# Patient Record
Sex: Female | Born: 1975 | Race: White | Hispanic: No | Smoking: Current every day smoker
Health system: Southern US, Community
[De-identification: ages and names within clinical notes are randomized; demographics above are authoritative.]

## PROBLEM LIST (undated history)

## (undated) DIAGNOSIS — F419 Anxiety disorder, unspecified: Secondary | ICD-10-CM

## (undated) DIAGNOSIS — J449 Chronic obstructive pulmonary disease, unspecified: Secondary | ICD-10-CM

## (undated) DIAGNOSIS — F32A Depression, unspecified: Secondary | ICD-10-CM

## (undated) DIAGNOSIS — F319 Bipolar disorder, unspecified: Secondary | ICD-10-CM

## (undated) DIAGNOSIS — D649 Anemia, unspecified: Secondary | ICD-10-CM

## (undated) DIAGNOSIS — R569 Unspecified convulsions: Secondary | ICD-10-CM

## (undated) DIAGNOSIS — F329 Major depressive disorder, single episode, unspecified: Secondary | ICD-10-CM

## (undated) DIAGNOSIS — K219 Gastro-esophageal reflux disease without esophagitis: Secondary | ICD-10-CM

## (undated) HISTORY — DX: Unspecified convulsions: R56.9

## (undated) HISTORY — DX: Gastro-esophageal reflux disease without esophagitis: K21.9

## (undated) HISTORY — DX: Bipolar disorder, unspecified: F31.9

## (undated) HISTORY — DX: Depression, unspecified: F32.A

## (undated) HISTORY — DX: Anemia, unspecified: D64.9

## (undated) HISTORY — DX: Chronic obstructive pulmonary disease, unspecified: J44.9

## (undated) HISTORY — DX: Major depressive disorder, single episode, unspecified: F32.9

## (undated) HISTORY — DX: Anxiety disorder, unspecified: F41.9

---
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
## 2004-08-16 ENCOUNTER — Ambulatory Visit: Payer: Self-pay | Admitting: Family Medicine

## 2005-09-15 ENCOUNTER — Ambulatory Visit: Payer: Self-pay | Admitting: Family Medicine

## 2005-09-21 ENCOUNTER — Ambulatory Visit (HOSPITAL_COMMUNITY): Admission: RE | Admit: 2005-09-21 | Discharge: 2005-09-21 | Payer: Self-pay | Admitting: Family Medicine

## 2005-10-03 ENCOUNTER — Encounter: Payer: Self-pay | Admitting: Family Medicine

## 2005-10-04 ENCOUNTER — Ambulatory Visit: Payer: Self-pay | Admitting: Family Medicine

## 2007-11-19 ENCOUNTER — Emergency Department (HOSPITAL_COMMUNITY): Admission: EM | Admit: 2007-11-19 | Discharge: 2007-11-19 | Payer: Self-pay | Admitting: Emergency Medicine

## 2009-09-09 ENCOUNTER — Encounter: Payer: Self-pay | Admitting: Family Medicine

## 2010-09-30 ENCOUNTER — Emergency Department (HOSPITAL_COMMUNITY)
Admission: EM | Admit: 2010-09-30 | Discharge: 2010-09-30 | Payer: Self-pay | Source: Home / Self Care | Admitting: Emergency Medicine

## 2010-10-30 ENCOUNTER — Encounter: Payer: Self-pay | Admitting: Family Medicine

## 2010-11-09 NOTE — Letter (Signed)
Summary: OFFICE NOTE  OFFICE NOTE   Imported By: Lind Guest 06/02/2010 15:27:40  _____________________________________________________________________  External Attachment:    Type:   Image     Comment:   External Document

## 2011-07-01 LAB — DIFFERENTIAL
Lymphocytes Relative: 26
Lymphs Abs: 2.1
Monocytes Relative: 7
Neutro Abs: 5.3
Neutrophils Relative %: 66

## 2011-07-01 LAB — URINALYSIS, ROUTINE W REFLEX MICROSCOPIC
Ketones, ur: NEGATIVE
Nitrite: POSITIVE — AB
Specific Gravity, Urine: 1.01
pH: 7

## 2011-07-01 LAB — BASIC METABOLIC PANEL
Calcium: 9
Chloride: 106
Creatinine, Ser: 0.73
GFR calc Af Amer: >60
GFR calc non Af Amer: >60

## 2011-07-01 LAB — CBC
MCV: 89
RBC: 4.5
WBC: 8.2

## 2011-07-01 LAB — ETHANOL: Alcohol, Ethyl (B): <5

## 2011-07-01 LAB — RAPID URINE DRUG SCREEN, HOSP PERFORMED
Benzodiazepines: POSITIVE — AB
Tetrahydrocannabinol: NOT DETECTED

## 2011-07-01 LAB — URINE MICROSCOPIC-ADD ON

## 2011-07-01 LAB — PREGNANCY, URINE: Preg Test, Ur: NEGATIVE

## 2014-09-09 ENCOUNTER — Inpatient Hospital Stay (HOSPITAL_COMMUNITY)
Admission: AD | Admit: 2014-09-09 | Discharge: 2014-09-15 | DRG: 200 | Disposition: A | Discharge status: Home / Self Care | Payer: Self-pay | Source: Other Acute Inpatient Hospital | Attending: General Surgery | Admitting: General Surgery

## 2014-09-09 DIAGNOSIS — H1131 Conjunctival hemorrhage, right eye: Secondary | ICD-10-CM | POA: Diagnosis present

## 2014-09-09 DIAGNOSIS — S028XXA Fractures of other specified skull and facial bones, initial encounter for closed fracture: Secondary | ICD-10-CM | POA: Diagnosis present

## 2014-09-09 DIAGNOSIS — D62 Acute posthemorrhagic anemia: Secondary | ICD-10-CM | POA: Diagnosis present

## 2014-09-09 DIAGNOSIS — S270XXA Traumatic pneumothorax, initial encounter: Secondary | ICD-10-CM | POA: Diagnosis present

## 2014-09-09 DIAGNOSIS — Z9689 Presence of other specified functional implants: Secondary | ICD-10-CM

## 2014-09-09 DIAGNOSIS — S272XXA Traumatic hemopneumothorax, initial encounter: Secondary | ICD-10-CM

## 2014-09-09 DIAGNOSIS — S2243XA Multiple fractures of ribs, bilateral, initial encounter for closed fracture: Secondary | ICD-10-CM | POA: Diagnosis present

## 2014-09-09 DIAGNOSIS — S02402A Zygomatic fracture, unspecified, initial encounter for closed fracture: Secondary | ICD-10-CM | POA: Diagnosis present

## 2014-09-09 DIAGNOSIS — T797XXA Traumatic subcutaneous emphysema, initial encounter: Secondary | ICD-10-CM | POA: Diagnosis present

## 2014-09-09 DIAGNOSIS — S0292XA Unspecified fracture of facial bones, initial encounter for closed fracture: Secondary | ICD-10-CM | POA: Diagnosis present

## 2014-09-09 DIAGNOSIS — J939 Pneumothorax, unspecified: Principal | ICD-10-CM | POA: Diagnosis present

## 2014-09-09 DIAGNOSIS — F17210 Nicotine dependence, cigarettes, uncomplicated: Secondary | ICD-10-CM | POA: Diagnosis present

## 2014-09-09 DIAGNOSIS — F4310 Post-traumatic stress disorder, unspecified: Secondary | ICD-10-CM | POA: Diagnosis present

## 2014-09-09 DIAGNOSIS — S32049A Unspecified fracture of fourth lumbar vertebra, initial encounter for closed fracture: Secondary | ICD-10-CM | POA: Diagnosis present

## 2014-09-09 DIAGNOSIS — F419 Anxiety disorder, unspecified: Secondary | ICD-10-CM | POA: Diagnosis present

## 2014-09-09 LAB — CBC
HEMATOCRIT: 37.6 % (ref 36.0–46.0)
Hemoglobin: 12.6 g/dL (ref 12.0–15.0)
MCH: 30 pg (ref 26.0–34.0)
MCHC: 33.5 g/dL (ref 30.0–36.0)
MCV: 89.5 fL (ref 78.0–100.0)
Platelets: 260 10*3/uL (ref 150–400)
RBC: 4.2 MIL/uL (ref 3.87–5.11)
RDW: 13.9 % (ref 11.5–15.5)
WBC: 7.1 10*3/uL (ref 4.0–10.5)

## 2014-09-09 LAB — CREATININE, SERUM
Creatinine, Ser: 0.83 mg/dL (ref 0.50–1.10)
GFR calc non Af Amer: 88 mL/min — ABNORMAL LOW (ref >90)

## 2014-09-09 LAB — MRSA PCR SCREENING: MRSA by PCR: NEGATIVE

## 2014-09-09 MED ORDER — OXYCODONE HCL 5 MG PO TABS
10.0000 mg | ORAL_TABLET | ORAL | Status: DC | PRN
  Administered 2014-09-11 (×2): 10 mg via ORAL
  Filled 2014-09-09 (×2): qty 2

## 2014-09-09 MED ORDER — ONDANSETRON HCL 4 MG PO TABS: 4.0000 mg | ORAL_TABLET | Freq: Four times a day (QID) | ORAL | Status: DC | PRN

## 2014-09-09 MED ORDER — PANTOPRAZOLE SODIUM 40 MG PO TBEC
40.0000 mg | DELAYED_RELEASE_TABLET | Freq: Every day | ORAL | Status: DC
  Administered 2014-09-09 – 2014-09-10 (×2): 40 mg via ORAL
  Filled 2014-09-09 (×2): qty 1

## 2014-09-09 MED ORDER — ONDANSETRON HCL 4 MG/2ML IJ SOLN: 4.0000 mg | Freq: Four times a day (QID) | INTRAMUSCULAR | Status: DC | PRN

## 2014-09-09 MED ORDER — HYDROMORPHONE HCL 1 MG/ML IJ SOLN
1.0000 mg | Freq: Once | INTRAMUSCULAR | Status: AC
  Administered 2014-09-09: 1 mg via INTRAVENOUS

## 2014-09-09 MED ORDER — DIAZEPAM 5 MG/ML IJ SOLN: 10.0000 mg | Freq: Three times a day (TID) | INTRAMUSCULAR | Status: DC | PRN

## 2014-09-09 MED ORDER — HYDROMORPHONE HCL 1 MG/ML IJ SOLN
INTRAMUSCULAR | Status: AC
  Filled 2014-09-09: qty 1

## 2014-09-09 MED ORDER — ENOXAPARIN SODIUM 40 MG/0.4ML ~~LOC~~ SOLN
40.0000 mg | SUBCUTANEOUS | Status: DC
  Administered 2014-09-10 – 2014-09-15 (×6): 40 mg via SUBCUTANEOUS
  Filled 2014-09-09 (×6): qty 0.4

## 2014-09-09 MED ORDER — OXYCODONE HCL 5 MG PO TABS
5.0000 mg | ORAL_TABLET | ORAL | Status: DC | PRN
  Administered 2014-09-10 – 2014-09-11 (×6): 5 mg via ORAL
  Filled 2014-09-09 (×6): qty 1

## 2014-09-09 MED ORDER — KCL IN DEXTROSE-NACL 20-5-0.45 MEQ/L-%-% IV SOLN
INTRAVENOUS | Status: DC
  Administered 2014-09-09 – 2014-09-11 (×3): via INTRAVENOUS
  Administered 2014-09-11: 50 mL/h via INTRAVENOUS
  Filled 2014-09-09 (×5): qty 1000

## 2014-09-09 MED ORDER — PANTOPRAZOLE SODIUM 40 MG IV SOLR
40.0000 mg | Freq: Every day | INTRAVENOUS | Status: DC
  Filled 2014-09-09 (×2): qty 40

## 2014-09-09 MED ORDER — HYDROMORPHONE HCL 1 MG/ML IJ SOLN
1.0000 mg | INTRAMUSCULAR | Status: DC | PRN
  Administered 2014-09-09 – 2014-09-10 (×2): 1 mg via INTRAVENOUS
  Filled 2014-09-09 (×2): qty 1
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
## 2014-09-10 ENCOUNTER — Inpatient Hospital Stay (HOSPITAL_COMMUNITY): Payer: PRIVATE HEALTH INSURANCE

## 2014-09-10 LAB — CBC
HCT: 38.7 % (ref 36.0–46.0)
HEMOGLOBIN: 12.7 g/dL (ref 12.0–15.0)
MCH: 28.9 pg (ref 26.0–34.0)
MCHC: 32.8 g/dL (ref 30.0–36.0)
MCV: 88 fL (ref 78.0–100.0)
Platelets: 246 10*3/uL (ref 150–400)
RBC: 4.4 MIL/uL (ref 3.87–5.11)
RDW: 13.8 % (ref 11.5–15.5)
WBC: 7.5 10*3/uL (ref 4.0–10.5)

## 2014-09-10 LAB — BASIC METABOLIC PANEL
Anion gap: 9 (ref 5–15)
BUN: 5 mg/dL — ABNORMAL LOW (ref 6–23)
CO2: 27 mEq/L (ref 19–32)
Calcium: 8.7 mg/dL (ref 8.4–10.5)
Chloride: 104 mEq/L (ref 96–112)
Creatinine, Ser: 0.8 mg/dL (ref 0.50–1.10)
GFR calc Af Amer: >90 mL/min (ref >90)
GFR calc non Af Amer: >90 mL/min (ref >90)
GLUCOSE: 112 mg/dL — AB (ref 70–99)
POTASSIUM: 3.5 meq/L — AB (ref 3.7–5.3)
Sodium: 140 mEq/L (ref 137–147)

## 2014-09-10 MED ORDER — IPRATROPIUM-ALBUTEROL 0.5-2.5 (3) MG/3ML IN SOLN
3.0000 mL | RESPIRATORY_TRACT | Status: DC
  Administered 2014-09-10 (×3): 3 mL via RESPIRATORY_TRACT
  Filled 2014-09-10 (×3): qty 3
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
## 2014-09-10 NOTE — Progress Notes (Signed)
Orthopedic Tech Progress Note Patient Details:  Caitlyn JewShana D Irwin 01-Jun-1976 [id number]  Patient ID: Caitlyn Irwin, female   DOB: 01-Jun-1976, 38 y.o.   MRN: [id number]  Called in bio-tech brace order; spoke with Anderson MaltaStephanie Martinez Boxx 09/10/2014, 12:05 PM

## 2014-09-10 NOTE — Progress Notes (Addendum)
Charting error. Disregard progress note.
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
## 2014-09-10 NOTE — Progress Notes (Signed)
UR completed.  Kmarion Rawl, RN BSN MHA CCM Trauma/Neuro ICU Case Manager [phone]  
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
## 2014-09-11 ENCOUNTER — Inpatient Hospital Stay (HOSPITAL_COMMUNITY): Payer: PRIVATE HEALTH INSURANCE

## 2014-09-11 ENCOUNTER — Encounter (HOSPITAL_COMMUNITY): Payer: Self-pay

## 2014-09-11 LAB — BASIC METABOLIC PANEL
Anion gap: 11 (ref 5–15)
BUN: 3 mg/dL — AB (ref 6–23)
CHLORIDE: 102 meq/L (ref 96–112)
CO2: 27 mEq/L (ref 19–32)
CREATININE: 0.69 mg/dL (ref 0.50–1.10)
Calcium: 8.5 mg/dL (ref 8.4–10.5)
GFR calc non Af Amer: >90 mL/min (ref >90)
GLUCOSE: 117 mg/dL — AB (ref 70–99)
POTASSIUM: 3.5 meq/L — AB (ref 3.7–5.3)
Sodium: 140 mEq/L (ref 137–147)

## 2014-09-11 LAB — CBC WITH DIFFERENTIAL/PLATELET
BASOS PCT: 0 % (ref 0–1)
Basophils Absolute: 0 10*3/uL (ref 0.0–0.1)
Eosinophils Absolute: 0.2 10*3/uL (ref 0.0–0.7)
Eosinophils Relative: 2 % (ref 0–5)
HEMATOCRIT: 35.7 % — AB (ref 36.0–46.0)
HEMOGLOBIN: 11.6 g/dL — AB (ref 12.0–15.0)
LYMPHS ABS: 1.6 10*3/uL (ref 0.7–4.0)
Lymphocytes Relative: 19 % (ref 12–46)
MCH: 28.6 pg (ref 26.0–34.0)
MCHC: 32.5 g/dL (ref 30.0–36.0)
MCV: 87.9 fL (ref 78.0–100.0)
MONO ABS: 0.5 10*3/uL (ref 0.1–1.0)
MONOS PCT: 6 % (ref 3–12)
Neutro Abs: 6.5 10*3/uL (ref 1.7–7.7)
Neutrophils Relative %: 73 % (ref 43–77)
Platelets: 238 10*3/uL (ref 150–400)
RBC: 4.06 MIL/uL (ref 3.87–5.11)
RDW: 14 % (ref 11.5–15.5)
WBC: 8.8 10*3/uL (ref 4.0–10.5)

## 2014-09-11 MED ORDER — POLYETHYLENE GLYCOL 3350 17 G PO PACK
17.0000 g | PACK | Freq: Every day | ORAL | Status: DC
  Administered 2014-09-12 – 2014-09-15 (×4): 17 g via ORAL
  Filled 2014-09-11 (×5): qty 1

## 2014-09-11 MED ORDER — DOCUSATE SODIUM 100 MG PO CAPS
100.0000 mg | ORAL_CAPSULE | Freq: Two times a day (BID) | ORAL | Status: DC
  Administered 2014-09-11 – 2014-09-15 (×8): 100 mg via ORAL
  Filled 2014-09-11 (×10): qty 1

## 2014-09-11 MED ORDER — DIAZEPAM 5 MG PO TABS
5.0000 mg | ORAL_TABLET | Freq: Three times a day (TID) | ORAL | Status: DC | PRN
  Administered 2014-09-13 (×2): 5 mg via ORAL
  Filled 2014-09-11 (×2): qty 1

## 2014-09-11 MED ORDER — NAPROXEN 500 MG PO TABS
500.0000 mg | ORAL_TABLET | Freq: Two times a day (BID) | ORAL | Status: DC
  Administered 2014-09-11 – 2014-09-15 (×8): 500 mg via ORAL
  Filled 2014-09-11 (×2): qty 1
  Filled 2014-09-11: qty 2
  Filled 2014-09-11 (×8): qty 1

## 2014-09-11 MED ORDER — IPRATROPIUM-ALBUTEROL 0.5-2.5 (3) MG/3ML IN SOLN
3.0000 mL | Freq: Two times a day (BID) | RESPIRATORY_TRACT | Status: DC
  Administered 2014-09-11 – 2014-09-12 (×2): 3 mL via RESPIRATORY_TRACT
  Filled 2014-09-11 (×2): qty 3

## 2014-09-11 MED ORDER — TRAMADOL HCL 50 MG PO TABS
100.0000 mg | ORAL_TABLET | Freq: Four times a day (QID) | ORAL | Status: DC
  Administered 2014-09-11 – 2014-09-15 (×16): 100 mg via ORAL
  Filled 2014-09-11 (×18): qty 2

## 2014-09-11 MED ORDER — OXYCODONE HCL 5 MG PO TABS
5.0000 mg | ORAL_TABLET | ORAL | Status: DC | PRN
  Administered 2014-09-11 – 2014-09-12 (×8): 10 mg via ORAL
  Administered 2014-09-13: 15 mg via ORAL
  Administered 2014-09-13: 10 mg via ORAL
  Administered 2014-09-13: 15 mg via ORAL
  Administered 2014-09-13: 10 mg via ORAL
  Administered 2014-09-13 – 2014-09-15 (×9): 15 mg via ORAL
  Filled 2014-09-11 (×3): qty 2
  Filled 2014-09-11 (×4): qty 3
  Filled 2014-09-11 (×3): qty 2
  Filled 2014-09-11: qty 3
  Filled 2014-09-11: qty 2
  Filled 2014-09-11 (×2): qty 3
  Filled 2014-09-11: qty 2
  Filled 2014-09-11: qty 3
  Filled 2014-09-11: qty 2
  Filled 2014-09-11: qty 3
  Filled 2014-09-11: qty 2
  Filled 2014-09-11: qty 3

## 2014-09-11 MED ORDER — HYDROMORPHONE HCL 1 MG/ML IJ SOLN
0.5000 mg | INTRAMUSCULAR | Status: DC | PRN
  Administered 2014-09-11: 0.5 mg via INTRAVENOUS
  Filled 2014-09-11: qty 1
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
## 2014-09-11 NOTE — Progress Notes (Signed)
Pt transferred to 6N29, pt vs WNL, pain medication given for pain 8-9/10, report given to receiving RN. RN at bedside upon admission, pt belongings with her and safety maintained.

## 2014-09-12 ENCOUNTER — Inpatient Hospital Stay (HOSPITAL_COMMUNITY): Payer: PRIVATE HEALTH INSURANCE

## 2014-09-12 DIAGNOSIS — S32049A Unspecified fracture of fourth lumbar vertebra, initial encounter for closed fracture: Secondary | ICD-10-CM | POA: Diagnosis present

## 2014-09-12 DIAGNOSIS — S2243XA Multiple fractures of ribs, bilateral, initial encounter for closed fracture: Secondary | ICD-10-CM | POA: Diagnosis present

## 2014-09-12 DIAGNOSIS — D62 Acute posthemorrhagic anemia: Secondary | ICD-10-CM | POA: Diagnosis not present

## 2014-09-12 DIAGNOSIS — S0292XA Unspecified fracture of facial bones, initial encounter for closed fracture: Secondary | ICD-10-CM | POA: Diagnosis present
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
## 2014-09-13 DIAGNOSIS — Z62810 Personal history of physical and sexual abuse in childhood: Secondary | ICD-10-CM

## 2014-09-13 DIAGNOSIS — F4310 Post-traumatic stress disorder, unspecified: Secondary | ICD-10-CM

## 2014-09-13 DIAGNOSIS — F17210 Nicotine dependence, cigarettes, uncomplicated: Secondary | ICD-10-CM

## 2014-09-13 MED ORDER — DIAZEPAM 5 MG PO TABS
ORAL_TABLET | ORAL | Status: DC
Start: 2014-09-13 — End: 2014-09-13
  Filled 2014-09-13: qty 1

## 2014-09-13 MED ORDER — BISACODYL 10 MG RE SUPP
10.0000 mg | Freq: Every day | RECTAL | Status: DC | PRN
Start: 2014-09-13 — End: 2014-09-15

## 2014-09-13 MED ORDER — ALPRAZOLAM 0.5 MG PO TABS
1.0000 mg | ORAL_TABLET | Freq: Three times a day (TID) | ORAL | Status: DC
  Administered 2014-09-13 – 2014-09-15 (×5): 1 mg via ORAL
  Filled 2014-09-13 (×5): qty 2

## 2014-09-13 MED ORDER — OXYCODONE HCL ER 10 MG PO T12A
EXTENDED_RELEASE_TABLET | ORAL | Status: AC
  Administered 2014-09-13: 10 mg
  Filled 2014-09-13: qty 1

## 2014-09-13 MED ORDER — OXYCODONE HCL 5 MG PO TABS
ORAL_TABLET | ORAL | Status: AC
  Filled 2014-09-13: qty 1

## 2014-09-13 MED ORDER — NAPROXEN 250 MG PO TABS
ORAL_TABLET | ORAL | Status: AC
  Filled 2014-09-13: qty 2

## 2014-09-13 MED ORDER — ZOLPIDEM TARTRATE 5 MG PO TABS: 5.0000 mg | ORAL_TABLET | Freq: Every evening | ORAL | Status: DC | PRN

## 2014-09-13 MED ORDER — QUETIAPINE FUMARATE 50 MG PO TABS
50.0000 mg | ORAL_TABLET | Freq: Every day | ORAL | Status: DC
  Administered 2014-09-13 – 2014-09-14 (×2): 50 mg via ORAL
  Filled 2014-09-13 (×5): qty 1

## 2014-09-13 MED ORDER — ACETAMINOPHEN 325 MG PO TABS: 650.0000 mg | ORAL_TABLET | Freq: Four times a day (QID) | ORAL | Status: DC | PRN

## 2014-09-13 MED ORDER — DIAZEPAM 5 MG PO TABS
5.0000 mg | ORAL_TABLET | Freq: Four times a day (QID) | ORAL | Status: DC | PRN
  Administered 2014-09-13: 5 mg via ORAL
  Filled 2014-09-13: qty 1

## 2014-09-13 NOTE — Progress Notes (Signed)
It is 5 PM and I do not see anything from Ps[facility], I will increase her Valium and give her some Ambien for sleep tonight.
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
## 2014-09-14 ENCOUNTER — Inpatient Hospital Stay (HOSPITAL_COMMUNITY): Payer: PRIVATE HEALTH INSURANCE

## 2014-09-14 NOTE — Progress Notes (Signed)
Calls placed to [phone] and [phone](numbers on the order to disclose) to inform detective Tommy MedalBrian Disher of the anticipated discharge today of Caitlyn Irwin. Left message on [phone] stating that patient could be discharged today.
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
## 2014-09-14 NOTE — Plan of Care (Signed)
Problem: Phase I Progression Outcomes Goal: Pain controlled with appropriate interventions Outcome: Completed/Met Date Met:  09/14/14 Goal: OOB as tolerated unless otherwise ordered Outcome: Completed/Met Date Met:  09/14/14 Goal: Voiding-avoid urinary catheter unless indicated Outcome: Completed/Met Date Met:  09/14/14 Goal: Hemodynamically stable Outcome: Completed/Met Date Met:  09/14/14  Problem: Phase II Progression Outcomes Goal: Progress activity as tolerated unless otherwise ordered Outcome: Completed/Met Date Met:  09/14/14 Goal: Vital signs remain stable Outcome: Completed/Met Date Met:  09/14/14 Goal: IV changed to normal saline lock Outcome: Completed/Met Date Met:  09/14/14

## 2014-09-15 ENCOUNTER — Inpatient Hospital Stay (HOSPITAL_COMMUNITY): Payer: PRIVATE HEALTH INSURANCE

## 2014-09-15 MED ORDER — ALPRAZOLAM 1 MG PO TABS: 1.0000 mg | ORAL_TABLET | Freq: Three times a day (TID) | ORAL | Status: DC

## 2014-09-15 MED ORDER — POLYETHYLENE GLYCOL 3350 17 G PO PACK: 17.0000 g | PACK | Freq: Every day | ORAL | Status: DC | PRN

## 2014-09-15 MED ORDER — TRAMADOL HCL 50 MG PO TABS: 100.0000 mg | ORAL_TABLET | Freq: Four times a day (QID) | ORAL | Status: DC

## 2014-09-15 MED ORDER — DSS 100 MG PO CAPS: 100.0000 mg | ORAL_CAPSULE | Freq: Two times a day (BID) | ORAL | Status: DC | PRN

## 2014-09-15 MED ORDER — ACETAMINOPHEN 325 MG PO TABS: 650.0000 mg | ORAL_TABLET | Freq: Four times a day (QID) | ORAL | Status: DC | PRN

## 2014-09-15 MED ORDER — OXYCODONE HCL 5 MG PO TABS: 5.0000 mg | ORAL_TABLET | ORAL | Status: DC | PRN

## 2014-09-15 MED ORDER — QUETIAPINE FUMARATE 50 MG PO TABS: 50.0000 mg | ORAL_TABLET | Freq: Every day | ORAL | Status: DC

## 2014-09-15 MED ORDER — NAPROXEN 500 MG PO TABS: 500.0000 mg | ORAL_TABLET | Freq: Two times a day (BID) | ORAL | Status: DC
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
## 2014-09-15 NOTE — Progress Notes (Signed)
Spoke with the SW [place] and notified about the communication with the [place] [employer], SW called them and verified , SW called this Clinical research associatewriter and stated that will go ahead release the patient.

## 2014-09-15 NOTE — Clinical Social Work Psych Note (Signed)
Psych CSW reviewed psychiatrist recommendations made on 09/14/2014 by MD Tenny Crawoss.  Psych CSW confirmed that follow-up information was placed on discharge summary and reviewed with patient.  Patient is to [facility] in [place] with MD Tenny Crawoss once discharged.  Vickii PennaGina Lesly Pontarelli, LCSWA [phone]  Psychiatric & Orthopedics (5N 1-16) Clinical Social Worker

## 2014-09-15 NOTE — Progress Notes (Signed)
Called [place] police at [phone] spoke with Wylene MenBryan Disheo. According to this person, it is okey to discharge patient.

## 2014-09-15 NOTE — Progress Notes (Signed)
Discharge home. Home discharge instruction given , prescription was also given to patient. No questions verbalized.
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
## 2014-09-15 NOTE — Discharge Instructions (Signed)
Pneumothorax °A pneumothorax, commonly called a collapsed lung, is a condition in which air leaks from a lung and builds up in the space between the lung and the chest wall (pleural space). The air in a pneumothorax is trapped outside the lung and takes up space, preventing the lung from fully expanding. This is a condition that usually occurs suddenly. The buildup of air may be small or large. A small pneumothorax may go away on its own. When a pneumothorax is larger, it will often require medical treatment and hospitalization.  °CAUSES  °A pneumothorax can sometimes happen quickly with no apparent cause. People with underlying lung problems, particularly COPD or emphysema, are at higher risk of pneumothorax. However, pneumothorax can happen quickly even in people with no prior known lung problems. Trauma, surgery, medical procedures, or injury to the chest wall can also cause a pneumothorax. °SIGNS AND SYMPTOMS  °Sometimes a pneumothorax will have no symptoms. When symptoms are present, they can include: °· Chest pain. °· Shortness of breath. °· Increased rate of breathing. °· Bluish color to your lips or skin (cyanosis). °DIAGNOSIS  °Pneumothorax is usually diagnosed by a chest X-ray or chest CT scan. Your health care provider will also take a medical history and perform a physical exam to determine why you may have a pneumothorax. °TREATMENT  °A small pneumothorax may go away on its own without treatment. Extra oxygen can sometimes help a small pneumothorax go away more quickly. For a larger pneumothorax or a pneumothorax that is causing symptoms, a procedure is usually needed to drain the air. In some cases, the health care provider may drain the air using a needle. In other cases, a chest tube may be inserted into the pleural space. A chest tube is a small tube placed between the ribs and into the pleural space. This removes the extra air and allows the lung to expand back to its normal size. A large  pneumothorax will usually require a hospital stay. If there is ongoing air leakage into the pleural space, then the chest tube may need to remain in place for several days until the air leak has healed. In some cases, surgery may be needed.  °HOME CARE INSTRUCTIONS  °· Only take over-the-counter or prescription medicines as directed by your health care provider. °· If a cough or pain makes it difficult for you to sleep at night, try sleeping in a semi-upright position in a recliner or by using 2 or 3 pillows. °· Rest and limit activity as directed by your health care provider. °· If you had a chest tube and it was removed, ask your health care provider when it is okay to remove the dressing. Until your health care provider says you can remove the dressing, do not allow it to get wet. °· Do not smoke. Smoking is a risk factor for pneumothorax. °· Do not fly in an airplane or scuba dive until your health care provider says it is okay. °· Follow up with your health care provider as directed. °SEEK IMMEDIATE MEDICAL CARE IF:  °· You have increasing chest pain or shortness of breath. °· You have a cough that is not controlled with suppressants. °· You begin coughing up blood. °· You have pain that is getting worse or is not controlled with medicines. °· You cough up thick, discolored mucus (sputum) that is yellow to green in color. °· You have redness, increasing pain, or discharge at the site where a chest tube had been in place (if   your pneumothorax was treated with a chest tube). °· The site where your chest tube was located opens up. °· You feel air coming out of the site where the chest tube was placed. °· You have a fever or persistent symptoms for more than 2-3 days. °· You have a fever and your symptoms suddenly get worse. °MAKE SURE YOU:  °· Understand these instructions. °· Will watch your condition. °· Will get help right away if you are not doing well or get worse. °Document Released: 09/26/2005 Document  Revised: 07/17/2013 Document Reviewed: 04/25/2013 °ExitCare® Patient Information ©2015 [employer]. This information is not intended to replace advice given to you by your health care provider. Make sure you discuss any questions you have with your health care provider. ° °

## 2014-09-23 ENCOUNTER — Telehealth (HOSPITAL_COMMUNITY): Payer: Self-pay

## 2014-09-23 NOTE — Telephone Encounter (Signed)
Called and left message.

## 2016-11-13 ENCOUNTER — Emergency Department (HOSPITAL_COMMUNITY)
Admission: EM | Admit: 2016-11-13 | Discharge: 2016-11-13 | Disposition: A | Discharge status: Home / Self Care | Payer: Self-pay | Attending: Emergency Medicine | Admitting: Emergency Medicine

## 2016-11-13 ENCOUNTER — Encounter (HOSPITAL_COMMUNITY): Payer: Self-pay | Admitting: Emergency Medicine

## 2016-11-13 DIAGNOSIS — F17210 Nicotine dependence, cigarettes, uncomplicated: Secondary | ICD-10-CM | POA: Insufficient documentation

## 2016-11-13 DIAGNOSIS — K047 Periapical abscess without sinus: Secondary | ICD-10-CM | POA: Insufficient documentation

## 2016-11-13 DIAGNOSIS — Z791 Long term (current) use of non-steroidal anti-inflammatories (NSAID): Secondary | ICD-10-CM | POA: Insufficient documentation

## 2016-11-13 MED ORDER — AMOXICILLIN 500 MG PO CAPS: 500.0000 mg | ORAL_CAPSULE | Freq: Three times a day (TID) | ORAL | 0 refills | Status: DC

## 2016-11-13 MED ORDER — DICLOFENAC SODIUM 75 MG PO TBEC: 75.0000 mg | DELAYED_RELEASE_TABLET | Freq: Two times a day (BID) | ORAL | 0 refills | Status: DC

## 2016-11-13 NOTE — Discharge Instructions (Signed)
Return if any problems.

## 2016-11-13 NOTE — ED Notes (Signed)
ED Provider at bedside. 

## 2016-11-13 NOTE — ED Triage Notes (Signed)
Patient c/o left upper dental pain x2 days ago. Per patient facial swelling. Per patient using orajel.
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
## 2018-02-19 ENCOUNTER — Ambulatory Visit: Payer: Self-pay | Admitting: Family Medicine

## 2018-10-31 DIAGNOSIS — Z139 Encounter for screening, unspecified: Secondary | ICD-10-CM

## 2018-10-31 LAB — GLUCOSE, POCT (MANUAL RESULT ENTRY): POC Glucose: 100 mg/dl — AB (ref 70–99)

## 2018-10-31 NOTE — Congregational Nurse Program (Signed)
  Dept: [phone]   Congregational Nurse Program Note  Date of Encounter: 10/31/2018  Past Medical History: No past medical history on file.  Encounter Details:   Pt presents to clinic with c/o of tooth pain ( upper dentures); States she is also needing a PCP due to not have seen a medical provider since 2007 and is wanting to re-start or anti-anxiety ad depression meds to assist with stablizing herself .         Vitals Signs checked and all WNL; Random Blood Glucose conducted and lpt stated she last ate at 9:30am;   Vitals signs reviewed with Nurse Case Manager and pt was referred to her to receive follow up contact post visit.  Pt was enrolled in [facility] program by finanical counselor, C.Broadnax,    Appt was made for [facility] on 11/06/18 at 11am

## 2018-11-06 ENCOUNTER — Encounter: Payer: Self-pay | Admitting: Physician Assistant

## 2018-11-06 ENCOUNTER — Ambulatory Visit: Payer: Medicaid Other | Admitting: Physician Assistant

## 2018-11-06 VITALS — BP 115/79 | HR 60 | Temp 97.7°F | Ht 65.5 in | Wt 226.5 lb

## 2018-11-06 DIAGNOSIS — F1911 Other psychoactive substance abuse, in remission: Secondary | ICD-10-CM

## 2018-11-06 DIAGNOSIS — K029 Dental caries, unspecified: Secondary | ICD-10-CM

## 2018-11-06 DIAGNOSIS — Z7689 Persons encountering health services in other specified circumstances: Secondary | ICD-10-CM

## 2018-11-06 DIAGNOSIS — Z1159 Encounter for screening for other viral diseases: Secondary | ICD-10-CM

## 2018-11-06 DIAGNOSIS — Z1239 Encounter for other screening for malignant neoplasm of breast: Secondary | ICD-10-CM

## 2018-11-06 DIAGNOSIS — R079 Chest pain, unspecified: Secondary | ICD-10-CM

## 2018-11-06 DIAGNOSIS — F39 Unspecified mood [affective] disorder: Secondary | ICD-10-CM

## 2018-11-06 DIAGNOSIS — E669 Obesity, unspecified: Secondary | ICD-10-CM

## 2018-11-06 DIAGNOSIS — F17201 Nicotine dependence, unspecified, in remission: Secondary | ICD-10-CM

## 2018-11-06 DIAGNOSIS — Z13220 Encounter for screening for lipoid disorders: Secondary | ICD-10-CM
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
## 2018-11-07 ENCOUNTER — Telehealth: Payer: Self-pay

## 2018-11-07 NOTE — Telephone Encounter (Signed)
Called to follow up with client after her initial appointment with [facility].  Last visit to [facility] client was given walk in times for [facility] to establish care and also list of food pantrys given to client at that time.

## 2018-11-19 ENCOUNTER — Other Ambulatory Visit (HOSPITAL_COMMUNITY)
Admission: RE | Admit: 2018-11-19 | Discharge: 2018-11-19 | Disposition: A | Discharge status: Home / Self Care | Payer: Self-pay | Source: Ambulatory Visit | Attending: Physician Assistant | Admitting: Physician Assistant

## 2018-11-19 DIAGNOSIS — Z1159 Encounter for screening for other viral diseases: Secondary | ICD-10-CM | POA: Insufficient documentation

## 2018-11-19 DIAGNOSIS — E669 Obesity, unspecified: Secondary | ICD-10-CM | POA: Insufficient documentation

## 2018-11-19 DIAGNOSIS — Z13220 Encounter for screening for lipoid disorders: Secondary | ICD-10-CM | POA: Insufficient documentation

## 2018-11-19 LAB — COMPREHENSIVE METABOLIC PANEL
ALT: 12 U/L (ref 0–44)
AST: 15 U/L (ref 15–41)
Albumin: 3.5 g/dL (ref 3.5–5.0)
Alkaline Phosphatase: 46 U/L (ref 38–126)
Anion gap: 7 (ref 5–15)
BUN: 11 mg/dL (ref 6–20)
CO2: 24 mmol/L (ref 22–32)
Calcium: 8.9 mg/dL (ref 8.9–10.3)
Chloride: 111 mmol/L (ref 98–111)
Creatinine, Ser: 0.71 mg/dL (ref 0.44–1.00)
GFR calc Af Amer: >60 mL/min (ref >60)
GFR calc non Af Amer: >60 mL/min (ref >60)
Glucose, Bld: 103 mg/dL — ABNORMAL HIGH (ref 70–99)
POTASSIUM: 3.7 mmol/L (ref 3.5–5.1)
Sodium: 142 mmol/L (ref 135–145)
Total Bilirubin: 0.5 mg/dL (ref 0.3–1.2)
Total Protein: 7 g/dL (ref 6.5–8.1)

## 2018-11-19 LAB — LIPID PANEL
Cholesterol: 215 mg/dL — ABNORMAL HIGH (ref 0–200)
HDL: 34 mg/dL — ABNORMAL LOW (ref >40)
LDL Cholesterol: 163 mg/dL — ABNORMAL HIGH (ref 0–99)
Total CHOL/HDL Ratio: 6.3 RATIO
Triglycerides: 90 mg/dL (ref <150)
VLDL: 18 mg/dL (ref 0–40)

## 2018-11-19 LAB — TSH: TSH: 0.592 u[IU]/mL (ref 0.350–4.500)

## 2018-11-20 ENCOUNTER — Ambulatory Visit: Payer: Medicaid Other | Admitting: Physician Assistant

## 2018-11-20 ENCOUNTER — Encounter: Payer: Self-pay | Admitting: Physician Assistant

## 2018-11-20 VITALS — BP 130/80 | HR 71 | Temp 97.5°F | Ht 65.5 in | Wt 226.0 lb

## 2018-11-20 DIAGNOSIS — E785 Hyperlipidemia, unspecified: Secondary | ICD-10-CM

## 2018-11-20 DIAGNOSIS — K029 Dental caries, unspecified: Secondary | ICD-10-CM

## 2018-11-20 DIAGNOSIS — F17201 Nicotine dependence, unspecified, in remission: Secondary | ICD-10-CM

## 2018-11-20 DIAGNOSIS — E669 Obesity, unspecified: Secondary | ICD-10-CM

## 2018-11-20 DIAGNOSIS — F39 Unspecified mood [affective] disorder: Secondary | ICD-10-CM

## 2018-11-20 DIAGNOSIS — Z1239 Encounter for other screening for malignant neoplasm of breast: Secondary | ICD-10-CM

## 2018-11-20 LAB — HEPATITIS C ANTIBODY: HCV Ab: <0.1 s/co ratio (ref 0.0–0.9)

## 2018-11-20 MED ORDER — SIMVASTATIN 20 MG PO TABS: 20.0000 mg | ORAL_TABLET | Freq: Every day | ORAL | 4 refills | Status: DC

## 2018-11-20 NOTE — Progress Notes (Signed)
BP 130/80 (BP Location: Left Arm, Patient Position: Sitting, Cuff Size: Normal)   Pulse 71   Temp (!) 97.5 F (36.4 C)   Ht 5' 5.5" (1.664 m)   Wt 226 lb (102.5 kg)   SpO2 97%   BMI 37.04 kg/m    Subjective:    Patient ID: Caitlyn Irwin, female    DOB: 1975-12-26, 43 y.o.   MRN: [id number]  HPI: Caitlyn JewShana D Madrid is a 43 y.o. female presenting on 11/20/2018 for Follow-up and Cyst (on R arm near axilla. for about 2 days. no drainage. Pt states it is sore, purple colored. )   HPI   [facility].  Pt says she mashed on the bump in her R armpit "but nothing came out"  Relevant past medical, surgical, family and social history reviewed and updated as indicated. Interim medical history since our last visit reviewed. Allergies and medications reviewed and updated.   Current Outpatient Medications:  .  acetaminophen (TYLENOL) 325 MG tablet, Take 2 tablets (650 mg total) by mouth every 6 (six) hours as needed for mild pain, moderate pain, fever or headache., Disp: , Rfl:  .  aspirin 81 MG chewable tablet, Chew 81 mg by mouth daily as needed., Disp: , Rfl:  .  Aspirin-Salicylamide-Caffeine (BC HEADACHE POWDER PO), Take by mouth., Disp: , Rfl:  .  Ibuprofen (IBU PO), Take 2 tablets by mouth 3 times/day as needed-between meals & bedtime., Disp: , Rfl:  .  Ibuprofen-diphenhydrAMINE Cit (ADVIL PM PO), Take by mouth., Disp: , Rfl:  .  polyethylene glycol (MIRALAX / GLYCOLAX) packet, Take 17 g by mouth daily as needed., Disp: 14 each, Rfl: 0 .  ALPRAZolam (XANAX) 1 MG tablet, Take 1 tablet (1 mg total) by mouth 3 (three) times daily. (Patient not taking: Reported on 11/20/2018), Disp: 30 tablet, Rfl: 0 .  QUEtiapine (SEROQUEL) 50 MG tablet, Take 1 tablet (50 mg total) by mouth at bedtime. (Patient not taking: Reported on 11/20/2018), Disp: 30 tablet, Rfl: 0   Review of Systems  Constitutional: Positive for diaphoresis and unexpected weight change.  Negative for appetite change, chills, fatigue and fever.  HENT: Positive for dental problem and hearing loss. Negative for congestion, drooling, ear pain, facial swelling, mouth sores, sneezing, sore throat, trouble swallowing and voice change.   Eyes: Negative for pain, discharge, redness, itching and visual disturbance.  Respiratory: Positive for shortness of breath and wheezing. Negative for cough and choking.   Cardiovascular: Positive for palpitations and leg swelling. Negative for chest pain.  Gastrointestinal: Positive for constipation. Negative for abdominal pain, blood in stool, diarrhea and vomiting.  Endocrine: Negative for cold intolerance, heat intolerance and polydipsia.  Genitourinary: Negative for decreased urine volume, dysuria and hematuria.  Musculoskeletal: Positive for arthralgias, back pain and gait problem.  Skin: Negative for rash.  Allergic/Immunologic: Positive for environmental allergies.  Neurological: Negative for seizures, syncope, light-headedness and headaches.  Hematological: Positive for adenopathy.  Psychiatric/Behavioral: Positive for agitation and dysphoric mood. Negative for suicidal ideas. The patient is nervous/anxious.     Per HPI unless specifically indicated above     Objective:    BP 130/80 (BP Location: Left Arm, Patient Position: Sitting, Cuff Size: Normal)   Pulse 71   Temp (!) 97.5 F (36.4 C)   Ht 5' 5.5" (1.664 m)   Wt 226 lb (102.5 kg)   SpO2 97%   BMI 37.04 kg/m   Wt Readings from Last  3 Encounters:  11/20/18 226 lb (102.5 kg)  11/06/18 226 lb 8 oz (102.7 kg)  10/31/18 234 lb 3.2 oz (106.2 kg)    Physical Exam Vitals signs reviewed.  Constitutional:      Appearance: She is well-developed.  HENT:     Head: Normocephalic and atraumatic.  Neck:     Musculoskeletal: Neck supple.  Cardiovascular:     Rate and Rhythm: Normal rate and regular rhythm.  Pulmonary:     Effort: Pulmonary effort is normal.     Breath sounds:  Normal breath sounds.  Abdominal:     General: Bowel sounds are normal.     Palpations: Abdomen is soft. There is no mass.     Tenderness: There is no abdominal tenderness.  Lymphadenopathy:     Cervical: No cervical adenopathy.  Skin:    General: Skin is warm and dry.     Comments: Small bruised area R axilla.  No abscess.  Neurological:     Mental Status: She is alert and oriented to person, place, and time.  Psychiatric:        Behavior: Behavior normal.     Results for orders placed or performed during the hospital encounter of 11/19/18  TSH  Result Value Ref Range   TSH 0.592 0.350 - 4.500 uIU/mL  Lipid panel  Result Value Ref Range   Cholesterol 215 (H) 0 - 200 mg/dL   Triglycerides 90 <468 mg/dL   HDL 34 (L) >03 mg/dL   Total CHOL/HDL Ratio 6.3 RATIO   VLDL 18 0 - 40 mg/dL   LDL Cholesterol 212 (H) 0 - 99 mg/dL  Hepatitis C Antibody  Result Value Ref Range   HCV Ab <0.1 0.0 - 0.9 s/co ratio  Comprehensive metabolic panel  Result Value Ref Range   Sodium 142 135 - 145 mmol/L   Potassium 3.7 3.5 - 5.1 mmol/L   Chloride 111 98 - 111 mmol/L   CO2 24 22 - 32 mmol/L   Glucose, Bld 103 (H) 70 - 99 mg/dL   BUN 11 6 - 20 mg/dL   Creatinine, Ser 2.48 0.44 - 1.00 mg/dL   Calcium 8.9 8.9 - 25.0 mg/dL   Total Protein 7.0 6.5 - 8.1 g/dL   Albumin 3.5 3.5 - 5.0 g/dL   AST 15 15 - 41 U/L   ALT 12 0 - 44 U/L   Alkaline Phosphatase 46 38 - 126 U/L   Total Bilirubin 0.5 0.3 - 1.2 mg/dL   GFR calc non Af Amer >60 >60 mL/min   GFR calc Af Amer >60 >60 mL/min   Anion gap 7 5 - 15      Assessment & Plan:   Encounter Diagnoses  Name Primary?  . Hyperlipidemia, unspecified hyperlipidemia type Yes  . Mood disorder (HCC)   . Tobacco use disorder, moderate, in early remission   . Obesity, unspecified classification, unspecified obesity type, unspecified whether serious comorbidity present   . Dental decay   . Screening for breast cancer    -Reviewed labs with pt -Re-refer  to mammogram -pt urged to get to  [facility] for counseling and treatment of her mood disorder -pt counseled on Lowfat diet and will start sivastatin -pt referred for Dental -pt to follow up 3 months.  RTO sooner prn

## 2018-11-26 ENCOUNTER — Other Ambulatory Visit: Payer: Self-pay | Admitting: Physician Assistant

## 2018-11-26 DIAGNOSIS — Z1239 Encounter for other screening for malignant neoplasm of breast: Secondary | ICD-10-CM

## 2018-12-07 ENCOUNTER — Ambulatory Visit (HOSPITAL_COMMUNITY): Payer: Medicaid Other

## 2018-12-07 ENCOUNTER — Encounter (HOSPITAL_COMMUNITY): Payer: Self-pay

## 2018-12-10 ENCOUNTER — Telehealth: Payer: Self-pay | Admitting: Student

## 2018-12-10 NOTE — Telephone Encounter (Signed)
LPN called pt back regarding voicemail. Pt c/o black stool, vomiting, leg aching/cramping, and head throbbing for the past 4 days (since 12-06-18). Pt states she started on simvastatin 12-04-18 and believes sx may be caused by medication.  PA advises for patient to d/c simvastatin and call back in a week or two to let the office know if her sx improve or not. Pt verbalized understanding.

## 2019-02-19 ENCOUNTER — Encounter: Payer: Self-pay | Admitting: Physician Assistant

## 2019-02-19 ENCOUNTER — Ambulatory Visit: Payer: Medicaid Other | Admitting: Physician Assistant

## 2019-02-19 DIAGNOSIS — F17201 Nicotine dependence, unspecified, in remission: Secondary | ICD-10-CM

## 2019-02-19 DIAGNOSIS — F329 Major depressive disorder, single episode, unspecified: Secondary | ICD-10-CM

## 2019-02-19 DIAGNOSIS — F32A Depression, unspecified: Secondary | ICD-10-CM

## 2019-02-19 DIAGNOSIS — E785 Hyperlipidemia, unspecified: Secondary | ICD-10-CM

## 2019-02-19 DIAGNOSIS — F39 Unspecified mood [affective] disorder: Secondary | ICD-10-CM

## 2019-02-19 MED ORDER — CITALOPRAM HYDROBROMIDE 20 MG PO TABS: 20.0000 mg | ORAL_TABLET | Freq: Every day | ORAL | 0 refills | Status: DC

## 2019-02-19 NOTE — Progress Notes (Signed)
There were no vitals taken for this visit.   Subjective:    Patient ID: Caitlyn Irwin, female    DOB: Mar 08, 1976, 43 y.o.   MRN: [id number]  HPI: Caitlyn Irwin is a 43 y.o. female presenting on 02/19/2019 for No chief complaint on file.   HPI  This is a telemedicine visit through [employer] due to coronavirus pandemic.  I connected with  Caitlyn Irwin on 02/19/19 by a video enabled telemedicine application and verified that I am speaking with the correct person using two identifiers.   I discussed the limitations of evaluation and management by telemedicine. The patient expressed understanding and agreed to proceed.    Pt stopped her Simvastatin that was prescribed at last OV due to it made her sick so she stopped it.  Pt contacted [facility]-  She hasn't made it over there a visit yet.  She says she is having a really hard time with depression recently because her Boyfriend has stage 4 cancer.    Pt is trying to follow CDC recommendations for CV19 in particular to help her sick boyfriend.     Relevant past medical, surgical, family and social history reviewed and updated as indicated. Interim medical history since our last visit reviewed. Allergies and medications reviewed and updated.  CURRENT MEDS: IBU  Review of Systems  Per HPI unless specifically indicated above     Objective:    There were no vitals taken for this visit.  Wt Readings from Last 3 Encounters:  11/20/18 226 lb (102.5 kg)  11/06/18 226 lb 8 oz (102.7 kg)  10/31/18 234 lb 3.2 oz (106.2 kg)    Physical Exam Constitutional:      Appearance: She is obese.  HENT:     Head: Normocephalic and atraumatic.  Pulmonary:     Effort: Pulmonary effort is normal. No respiratory distress.  Neurological:     Mental Status: She is alert and oriented to person, place, and time.  Psychiatric:        Attention and Perception: Attention normal.        Mood and Affect: Affect is tearful.        Speech:  Speech normal.        Behavior: Behavior is cooperative.        Thought Content: Thought content normal.     Results for orders placed or performed during the hospital encounter of 11/19/18  TSH  Result Value Ref Range   TSH 0.592 0.350 - 4.500 uIU/mL  Lipid panel  Result Value Ref Range   Cholesterol 215 (H) 0 - 200 mg/dL   Triglycerides 90 <409<150 mg/dL   HDL 34 (L) >81>40 mg/dL   Total CHOL/HDL Ratio 6.3 RATIO   VLDL 18 0 - 40 mg/dL   LDL Cholesterol 191163 (H) 0 - 99 mg/dL  Hepatitis C Antibody  Result Value Ref Range   HCV Ab <0.1 0.0 - 0.9 s/co ratio  Comprehensive metabolic panel  Result Value Ref Range   Sodium 142 135 - 145 mmol/L   Potassium 3.7 3.5 - 5.1 mmol/L   Chloride 111 98 - 111 mmol/L   CO2 24 22 - 32 mmol/L   Glucose, Bld 103 (H) 70 - 99 mg/dL   BUN 11 6 - 20 mg/dL   Creatinine, Ser 4.780.71 0.44 - 1.00 mg/dL   Calcium 8.9 8.9 - 29.510.3 mg/dL   Total Protein 7.0 6.5 - 8.1 g/dL   Albumin 3.5 3.5 - 5.0 g/dL  AST 15 15 - 41 U/L   ALT 12 0 - 44 U/L   Alkaline Phosphatase 46 38 - 126 U/L   Total Bilirubin 0.5 0.3 - 1.2 mg/dL   GFR calc non Af Amer >60 >60 mL/min   GFR calc Af Amer >60 >60 mL/min   Anion gap 7 5 - 15      Assessment & Plan:    Encounter Diagnoses  Name Primary?  . Depression, unspecified depression type Yes  . Mood disorder (HCC)   . Tobacco use disorder, moderate, in early remission   . Hyperlipidemia, unspecified hyperlipidemia type     -will start pt on Citalopram.  Discussed with her that she needs to get back in touch with [facility] for counseling.  Discussed that they will take over her antidepressant medicaiton when she gets in there.  She agrees -pt is on list for screening mammoram -pt is on Dental list -pt would like to wait to discuss hyperlipidemia until she is doing better -pt will follow up in 2 weeks to recheck mood on citalopram.  She is to contact office sooner prn

## 2019-02-28 ENCOUNTER — Other Ambulatory Visit: Payer: Self-pay | Admitting: Physician Assistant

## 2019-02-28 DIAGNOSIS — Z1239 Encounter for other screening for malignant neoplasm of breast: Secondary | ICD-10-CM

## 2019-03-06 ENCOUNTER — Encounter: Payer: Self-pay | Admitting: Physician Assistant

## 2019-03-06 ENCOUNTER — Ambulatory Visit: Payer: Medicaid Other | Admitting: Physician Assistant

## 2019-03-06 DIAGNOSIS — F329 Major depressive disorder, single episode, unspecified: Secondary | ICD-10-CM

## 2019-03-06 DIAGNOSIS — E785 Hyperlipidemia, unspecified: Secondary | ICD-10-CM

## 2019-03-06 DIAGNOSIS — F39 Unspecified mood [affective] disorder: Secondary | ICD-10-CM

## 2019-03-06 DIAGNOSIS — F17201 Nicotine dependence, unspecified, in remission: Secondary | ICD-10-CM

## 2019-03-06 DIAGNOSIS — F32A Depression, unspecified: Secondary | ICD-10-CM

## 2019-03-06 MED ORDER — CITALOPRAM HYDROBROMIDE 20 MG PO TABS: 20.0000 mg | ORAL_TABLET | Freq: Every day | ORAL | 0 refills | Status: DC

## 2019-03-06 NOTE — Progress Notes (Signed)
There were no vitals taken for this visit.   Subjective:    Patient ID: Caitlyn Irwin, female    DOB: 1976/01/05, 43 y.o.   MRN: [id number]  HPI: Caitlyn JewShana D Mccarver is a 43 y.o. female presenting on 03/06/2019 for No chief complaint on file.   HPI  This is a telemedicine visit through [employer] due to coronavirus pandemic . I connected with  Deundra D Zalar on 03/06/19 by a video enabled telemedicine application and verified that I am speaking with the correct person using two identifiers.   I discussed the limitations of evaluation and management by telemedicine. The patient expressed understanding and agreed to proceed.   Pt is at her grandkid's home.  Provider is at office/clinic  [facility] and is waiting to get set up with appointment  Pt thinks the citalopram is helping some.  She is still having a lot of anxiety.   She denies SI, HI.     Relevant past medical, surgical, family and social history reviewed and updated as indicated. Interim medical history since our last visit reviewed. Allergies and medications reviewed and updated.   Current Outpatient Medications:  .  aspirin 81 MG chewable tablet, Chew 81 mg by mouth daily as needed., Disp: , Rfl:  .  Aspirin-Salicylamide-Caffeine (BC HEADACHE POWDER PO), Take by mouth., Disp: , Rfl:  .  citalopram (CELEXA) 20 MG tablet, Take 1 tablet (20 mg total) by mouth daily., Disp: 30 tablet, Rfl: 0 .  Ibuprofen (IBU PO), Take 2 tablets by mouth 3 times/day as needed-between meals & bedtime., Disp: , Rfl:  .  Ibuprofen-diphenhydrAMINE Cit (ADVIL PM PO), Take by mouth., Disp: , Rfl:  .  acetaminophen (TYLENOL) 325 MG tablet, Take 2 tablets (650 mg total) by mouth every 6 (six) hours as needed for mild pain, moderate pain, fever or headache. (Patient not taking: Reported on 02/19/2019), Disp: , Rfl:  .  ALPRAZolam (XANAX) 1 MG tablet, Take 1 tablet (1 mg total) by mouth 3 (three) times daily. (Patient not taking:  Reported on 11/20/2018), Disp: 30 tablet, Rfl: 0 .  polyethylene glycol (MIRALAX / GLYCOLAX) packet, Take 17 g by mouth daily as needed. (Patient not taking: Reported on 02/19/2019), Disp: 14 each, Rfl: 0 .  QUEtiapine (SEROQUEL) 50 MG tablet, Take 1 tablet (50 mg total) by mouth at bedtime. (Patient not taking: Reported on 11/20/2018), Disp: 30 tablet, Rfl: 0 .  simvastatin (ZOCOR) 20 MG tablet, Take 1 tablet (20 mg total) by mouth at bedtime. (Patient not taking: Reported on 02/19/2019), Disp: 30 tablet, Rfl: 4    Review of Systems  Per HPI unless specifically indicated above     Objective:    There were no vitals taken for this visit.  Wt Readings from Last 3 Encounters:  11/20/18 226 lb (102.5 kg)  11/06/18 226 lb 8 oz (102.7 kg)  10/31/18 234 lb 3.2 oz (106.2 kg)    Physical Exam Constitutional:      General: She is not in acute distress.    Appearance: She is not ill-appearing.  HENT:     Head: Normocephalic and atraumatic.  Pulmonary:     Effort: Pulmonary effort is normal. No respiratory distress.  Neurological:     Mental Status: She is alert and oriented to person, place, and time.  Psychiatric:        Attention and Perception: Attention normal.        Mood and Affect: Mood is anxious and depressed.  Speech: Speech normal.        Behavior: Behavior is cooperative.     Comments: Pt somewhat disheveled.  She is talking with her granddaughter who is about 7yo and interrupting her frequently.             Assessment & Plan:    Encounter Diagnoses  Name Primary?  . Mood disorder (HCC) Yes  . Depression, unspecified depression type   . Tobacco use disorder, moderate, in early remission   . Hyperlipidemia, unspecified hyperlipidemia type     -Pt is on list for screening mammogram -pt is on dental list -Discussed updating PAP but wants to wait until she is feeling better.  -pt is reminded to continue follow up with [facility] for [facility] issue -pt is Not taking  statin due to aches- whe wants to wait to discuss her cholesterol until her mood is improved -pt to follow up for cholesterol in  3 months.  She is to contact office sooner if needed or if unable to follow up with [facility] for her mood

## 2019-03-20 ENCOUNTER — Ambulatory Visit (HOSPITAL_COMMUNITY): Payer: Medicaid Other

## 2019-03-21 ENCOUNTER — Encounter (INDEPENDENT_AMBULATORY_CARE_PROVIDER_SITE_OTHER): Payer: Self-pay

## 2019-04-01 ENCOUNTER — Ambulatory Visit (HOSPITAL_COMMUNITY): Payer: Self-pay

## 2019-04-01 ENCOUNTER — Encounter (HOSPITAL_COMMUNITY): Payer: Self-pay

## 2019-06-11 ENCOUNTER — Ambulatory Visit: Payer: Medicaid Other | Admitting: Physician Assistant

## 2019-06-25 ENCOUNTER — Ambulatory Visit: Payer: Medicaid Other | Admitting: Physician Assistant

## 2021-04-28 ENCOUNTER — Emergency Department (HOSPITAL_COMMUNITY): Payer: 59

## 2021-04-28 ENCOUNTER — Observation Stay (HOSPITAL_COMMUNITY)
Admission: EM | Admit: 2021-04-28 | Discharge: 2021-04-29 | Disposition: A | Discharge status: Home / Self Care | Payer: 59 | Attending: Family Medicine | Admitting: Family Medicine

## 2021-04-28 ENCOUNTER — Encounter (HOSPITAL_COMMUNITY): Payer: Self-pay | Admitting: Emergency Medicine

## 2021-04-28 ENCOUNTER — Other Ambulatory Visit: Payer: Self-pay

## 2021-04-28 DIAGNOSIS — F419 Anxiety disorder, unspecified: Secondary | ICD-10-CM | POA: Diagnosis present

## 2021-04-28 DIAGNOSIS — F319 Bipolar disorder, unspecified: Secondary | ICD-10-CM | POA: Diagnosis present

## 2021-04-28 DIAGNOSIS — J449 Chronic obstructive pulmonary disease, unspecified: Secondary | ICD-10-CM | POA: Diagnosis not present

## 2021-04-28 DIAGNOSIS — I200 Unstable angina: Secondary | ICD-10-CM | POA: Diagnosis not present

## 2021-04-28 DIAGNOSIS — F17210 Nicotine dependence, cigarettes, uncomplicated: Secondary | ICD-10-CM | POA: Diagnosis not present

## 2021-04-28 DIAGNOSIS — R079 Chest pain, unspecified: Secondary | ICD-10-CM | POA: Diagnosis present

## 2021-04-28 DIAGNOSIS — E785 Hyperlipidemia, unspecified: Secondary | ICD-10-CM | POA: Diagnosis present

## 2021-04-28 DIAGNOSIS — Z7982 Long term (current) use of aspirin: Secondary | ICD-10-CM | POA: Insufficient documentation

## 2021-04-28 DIAGNOSIS — R0789 Other chest pain: Secondary | ICD-10-CM | POA: Diagnosis present

## 2021-04-28 DIAGNOSIS — E876 Hypokalemia: Secondary | ICD-10-CM | POA: Insufficient documentation

## 2021-04-28 DIAGNOSIS — Z20822 Contact with and (suspected) exposure to covid-19: Secondary | ICD-10-CM | POA: Insufficient documentation

## 2021-04-28 LAB — CBC
HCT: 43 % (ref 36.0–46.0)
Hemoglobin: 13.9 g/dL (ref 12.0–15.0)
MCH: 27.4 pg (ref 26.0–34.0)
MCHC: 32.3 g/dL (ref 30.0–36.0)
MCV: 84.6 fL (ref 80.0–100.0)
Platelets: 254 10*3/uL (ref 150–400)
RBC: 5.08 MIL/uL (ref 3.87–5.11)
RDW: 15.5 % (ref 11.5–15.5)
WBC: 7.3 10*3/uL (ref 4.0–10.5)
nRBC: 0 % (ref 0.0–0.2)

## 2021-04-28 LAB — TROPONIN I (HIGH SENSITIVITY): Troponin I (High Sensitivity): 5 ng/L (ref <18)

## 2021-04-28 LAB — BASIC METABOLIC PANEL
Anion gap: 10 (ref 5–15)
BUN: 8 mg/dL (ref 6–20)
CO2: 26 mmol/L (ref 22–32)
Calcium: 8.9 mg/dL (ref 8.9–10.3)
Chloride: 102 mmol/L (ref 98–111)
Creatinine, Ser: 0.7 mg/dL (ref 0.44–1.00)
GFR, Estimated: >60 mL/min (ref >60)
Glucose, Bld: 106 mg/dL — ABNORMAL HIGH (ref 70–99)
Potassium: 2.7 mmol/L — CL (ref 3.5–5.1)
Sodium: 138 mmol/L (ref 135–145)

## 2021-04-28 LAB — D-DIMER, QUANTITATIVE: D-Dimer, Quant: 0.52 ug/mL-FEU — ABNORMAL HIGH (ref 0.00–0.50)

## 2021-04-28 MED ORDER — NITROGLYCERIN 0.4 MG SL SUBL
0.4000 mg | SUBLINGUAL_TABLET | SUBLINGUAL | Status: DC | PRN
Start: 2021-04-28 — End: 2021-04-29
  Administered 2021-04-29: 0.4 mg via SUBLINGUAL
  Filled 2021-04-28: qty 1

## 2021-04-28 MED ORDER — ASPIRIN 81 MG PO CHEW
324.0000 mg | CHEWABLE_TABLET | Freq: Once | ORAL | Status: AC
  Administered 2021-04-28: 324 mg via ORAL
  Filled 2021-04-28: qty 4
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
## 2021-04-28 NOTE — ED Triage Notes (Signed)
Pt c/o intermittent chest pain x 2 months that got worse at work today with vomiting.

## 2021-04-28 NOTE — ED Notes (Signed)
Date and time results received: 04/28/21 2341  Test: potassium  Critical Value: 2.7  Name of Provider Notified: Dr. Clayborne Dana  Orders Received? Or Actions Taken?: awaiting orders

## 2021-04-29 ENCOUNTER — Emergency Department (HOSPITAL_COMMUNITY): Payer: 59

## 2021-04-29 ENCOUNTER — Encounter (HOSPITAL_COMMUNITY): Payer: Self-pay | Admitting: Family Medicine

## 2021-04-29 ENCOUNTER — Observation Stay (HOSPITAL_BASED_OUTPATIENT_CLINIC_OR_DEPARTMENT_OTHER): Payer: 59

## 2021-04-29 DIAGNOSIS — R001 Bradycardia, unspecified: Secondary | ICD-10-CM

## 2021-04-29 DIAGNOSIS — E785 Hyperlipidemia, unspecified: Secondary | ICD-10-CM | POA: Diagnosis present

## 2021-04-29 DIAGNOSIS — E876 Hypokalemia: Secondary | ICD-10-CM

## 2021-04-29 DIAGNOSIS — F419 Anxiety disorder, unspecified: Secondary | ICD-10-CM | POA: Diagnosis present

## 2021-04-29 DIAGNOSIS — F319 Bipolar disorder, unspecified: Secondary | ICD-10-CM | POA: Diagnosis present

## 2021-04-29 DIAGNOSIS — R079 Chest pain, unspecified: Secondary | ICD-10-CM

## 2021-04-29 DIAGNOSIS — J449 Chronic obstructive pulmonary disease, unspecified: Secondary | ICD-10-CM | POA: Diagnosis present

## 2021-04-29 LAB — COMPREHENSIVE METABOLIC PANEL
ALT: 20 U/L (ref 0–44)
AST: 21 U/L (ref 15–41)
Albumin: 3.5 g/dL (ref 3.5–5.0)
Alkaline Phosphatase: 50 U/L (ref 38–126)
Anion gap: 6 (ref 5–15)
BUN: 8 mg/dL (ref 6–20)
CO2: 26 mmol/L (ref 22–32)
Calcium: 8.6 mg/dL — ABNORMAL LOW (ref 8.9–10.3)
Chloride: 105 mmol/L (ref 98–111)
Creatinine, Ser: 0.7 mg/dL (ref 0.44–1.00)
GFR, Estimated: >60 mL/min (ref >60)
Glucose, Bld: 128 mg/dL — ABNORMAL HIGH (ref 70–99)
Potassium: 3.8 mmol/L (ref 3.5–5.1)
Sodium: 137 mmol/L (ref 135–145)
Total Bilirubin: 1 mg/dL (ref 0.3–1.2)
Total Protein: 6.8 g/dL (ref 6.5–8.1)

## 2021-04-29 LAB — HIV ANTIBODY (ROUTINE TESTING W REFLEX): HIV Screen 4th Generation wRfx: NONREACTIVE

## 2021-04-29 LAB — ECHOCARDIOGRAM COMPLETE
Area-P 1/2: 2.46 cm2
Height: 67 in
S' Lateral: 3.09 cm
Weight: 3568 oz

## 2021-04-29 LAB — LIPID PANEL
Cholesterol: 236 mg/dL — ABNORMAL HIGH (ref 0–200)
HDL: 33 mg/dL — ABNORMAL LOW (ref >40)
LDL Cholesterol: 182 mg/dL — ABNORMAL HIGH (ref 0–99)
Total CHOL/HDL Ratio: 7.2 RATIO
Triglycerides: 103 mg/dL (ref <150)
VLDL: 21 mg/dL (ref 0–40)

## 2021-04-29 LAB — HEPATIC FUNCTION PANEL
ALT: 20 U/L (ref 0–44)
AST: 25 U/L (ref 15–41)
Albumin: 3.7 g/dL (ref 3.5–5.0)
Alkaline Phosphatase: 55 U/L (ref 38–126)
Bilirubin, Direct: 0.1 mg/dL (ref 0.0–0.2)
Indirect Bilirubin: 0.7 mg/dL (ref 0.3–0.9)
Total Bilirubin: 0.8 mg/dL (ref 0.3–1.2)
Total Protein: 7.3 g/dL (ref 6.5–8.1)

## 2021-04-29 LAB — TSH: TSH: 1.091 u[IU]/mL (ref 0.350–4.500)

## 2021-04-29 LAB — HEMOGLOBIN A1C
Hgb A1c MFr Bld: 6 % — ABNORMAL HIGH (ref 4.8–5.6)
Mean Plasma Glucose: 125.5 mg/dL

## 2021-04-29 LAB — RESP PANEL BY RT-PCR (FLU A&B, COVID) ARPGX2
Influenza A by PCR: NEGATIVE
Influenza B by PCR: NEGATIVE
SARS Coronavirus 2 by RT PCR: NEGATIVE

## 2021-04-29 LAB — TROPONIN I (HIGH SENSITIVITY): Troponin I (High Sensitivity): 4 ng/L (ref <18)

## 2021-04-29 LAB — MAGNESIUM: Magnesium: 2.1 mg/dL (ref 1.7–2.4)

## 2021-04-29 MED ORDER — CITALOPRAM HYDROBROMIDE 20 MG PO TABS
20.0000 mg | ORAL_TABLET | Freq: Every day | ORAL | Status: DC
  Filled 2021-04-29: qty 1

## 2021-04-29 MED ORDER — PERFLUTREN LIPID MICROSPHERE
1.0000 mL | INTRAVENOUS | Status: AC | PRN
  Administered 2021-04-29: 2 mL via INTRAVENOUS
  Filled 2021-04-29: qty 10

## 2021-04-29 MED ORDER — ARIPIPRAZOLE 10 MG PO TABS
10.0000 mg | ORAL_TABLET | Freq: Every day | ORAL | Status: DC
  Administered 2021-04-29: 10 mg via ORAL
  Filled 2021-04-29: qty 1

## 2021-04-29 MED ORDER — ONDANSETRON HCL 4 MG/2ML IJ SOLN: 4.0000 mg | Freq: Four times a day (QID) | INTRAMUSCULAR | Status: DC | PRN

## 2021-04-29 MED ORDER — BUPRENORPHINE HCL 2 MG SL SUBL
8.0000 mg | SUBLINGUAL_TABLET | Freq: Two times a day (BID) | SUBLINGUAL | Status: DC
Start: 2021-04-29 — End: 2021-04-29

## 2021-04-29 MED ORDER — ATORVASTATIN CALCIUM 40 MG PO TABS: 40.0000 mg | ORAL_TABLET | Freq: Every evening | ORAL | Status: DC

## 2021-04-29 MED ORDER — ONDANSETRON HCL 4 MG/2ML IJ SOLN
4.0000 mg | Freq: Once | INTRAMUSCULAR | Status: AC
  Administered 2021-04-29: 4 mg via INTRAVENOUS
  Filled 2021-04-29: qty 2

## 2021-04-29 MED ORDER — ALPRAZOLAM 0.25 MG PO TABS
0.2500 mg | ORAL_TABLET | Freq: Three times a day (TID) | ORAL | Status: DC | PRN
  Administered 2021-04-29: 0.25 mg via ORAL
  Filled 2021-04-29: qty 1

## 2021-04-29 MED ORDER — POTASSIUM CHLORIDE 10 MEQ/100ML IV SOLN
10.0000 meq | INTRAVENOUS | Status: AC
Start: 2021-04-29 — End: 2021-04-29
  Administered 2021-04-29 (×3): 10 meq via INTRAVENOUS
  Filled 2021-04-29 (×3): qty 100

## 2021-04-29 MED ORDER — ASPIRIN 81 MG PO CHEW
81.0000 mg | CHEWABLE_TABLET | Freq: Every day | ORAL | Status: DC
  Administered 2021-04-29: 81 mg via ORAL
  Filled 2021-04-29: qty 1

## 2021-04-29 MED ORDER — ASPIRIN 81 MG PO CHEW: 81.0000 mg | CHEWABLE_TABLET | Freq: Every day | ORAL | Status: DC

## 2021-04-29 MED ORDER — HEPARIN SODIUM (PORCINE) 5000 UNIT/ML IJ SOLN
5000.0000 [IU] | Freq: Three times a day (TID) | INTRAMUSCULAR | Status: DC
  Administered 2021-04-29: 5000 [IU] via SUBCUTANEOUS
  Filled 2021-04-29: qty 1

## 2021-04-29 MED ORDER — IOHEXOL 350 MG/ML SOLN
100.0000 mL | Freq: Once | INTRAVENOUS | Status: AC | PRN
  Administered 2021-04-29: 100 mL via INTRAVENOUS

## 2021-04-29 MED ORDER — ALPRAZOLAM 0.5 MG PO TABS: 0.5000 mg | ORAL_TABLET | Freq: Three times a day (TID) | ORAL | Status: DC | PRN

## 2021-04-29 MED ORDER — PRAZOSIN HCL 2 MG PO CAPS
2.0000 mg | ORAL_CAPSULE | Freq: Every day | ORAL | Status: DC
  Filled 2021-04-29: qty 1

## 2021-04-29 MED ORDER — NICOTINE 14 MG/24HR TD PT24
14.0000 mg | MEDICATED_PATCH | Freq: Every day | TRANSDERMAL | Status: DC
  Administered 2021-04-29: 14 mg via TRANSDERMAL
  Filled 2021-04-29: qty 1

## 2021-04-29 MED ORDER — QUETIAPINE FUMARATE 25 MG PO TABS: 50.0000 mg | ORAL_TABLET | Freq: Every day | ORAL | Status: DC

## 2021-04-29 MED ORDER — ATORVASTATIN CALCIUM 40 MG PO TABS
40.0000 mg | ORAL_TABLET | Freq: Every evening | ORAL | 1 refills | Status: DC
Start: 2021-04-29 — End: 2021-06-25

## 2021-04-29 MED ORDER — ALUM & MAG HYDROXIDE-SIMETH 200-200-20 MG/5ML PO SUSP
30.0000 mL | Freq: Once | ORAL | Status: AC
  Administered 2021-04-29: 30 mL via ORAL
  Filled 2021-04-29: qty 30

## 2021-04-29 MED ORDER — ARIPIPRAZOLE 10 MG PO TABS: 10.0000 mg | ORAL_TABLET | Freq: Every day | ORAL | Status: DC

## 2021-04-29 MED ORDER — POTASSIUM CHLORIDE CRYS ER 20 MEQ PO TBCR
40.0000 meq | EXTENDED_RELEASE_TABLET | Freq: Once | ORAL | Status: AC
  Administered 2021-04-29: 40 meq via ORAL
  Filled 2021-04-29: qty 2

## 2021-04-29 MED ORDER — BUPRENORPHINE HCL 2 MG SL SUBL
8.0000 mg | SUBLINGUAL_TABLET | Freq: Every day | SUBLINGUAL | Status: DC
  Administered 2021-04-29: 8 mg via SUBLINGUAL
  Filled 2021-04-29: qty 4

## 2021-04-29 MED ORDER — ACETAMINOPHEN 325 MG PO TABS
650.0000 mg | ORAL_TABLET | ORAL | Status: DC | PRN
Start: 2021-04-29 — End: 2021-04-29

## 2021-04-29 NOTE — ED Notes (Signed)
Ambulated patient around the nursing station per EDP. Patient stated that she felt short of breath. 02 sat was 85 percent on room air. Patient states that she felt weak once back in room. Patient 02 sat 93 percent room air once back in bed.

## 2021-04-29 NOTE — Discharge Instructions (Signed)
IMPORTANT INFORMATION: PAY CLOSE ATTENTION   PHYSICIAN DISCHARGE INSTRUCTIONS  Follow with Primary care provider and other consultants as instructed by your Hospitalist Physician  SEEK MEDICAL CARE OR RETURN TO EMERGENCY ROOM IF SYMPTOMS COME BACK, WORSEN OR NEW PROBLEM DEVELOPS   Please note: You were cared for by a hospitalist during your hospital stay. Every effort will be made to forward records to your primary care provider.  You can request that your primary care provider send for your hospital records if they have not received them.  Once you are discharged, your primary care physician will handle any further medical issues. Please note that NO REFILLS for any discharge medications will be authorized once you are discharged, as it is imperative that you return to your primary care physician (or establish a relationship with a primary care physician if you do not have one) for your post hospital discharge needs so that they can reassess your need for medications and monitor your lab values.  Please get a complete blood count and chemistry panel checked by your Primary MD at your next visit, and again as instructed by your Primary MD.  Get Medicines reviewed and adjusted: Please take all your medications with you for your next visit with your Primary MD  Laboratory/radiological data: Please request your Primary MD to go over all hospital tests and procedure/radiological results at the follow up, please ask your primary care provider to get all Hospital records sent to his/her office.  In some cases, they will be blood work, cultures and biopsy results pending at the time of your discharge. Please request that your primary care provider follow up on these results.  If you are diabetic, please bring your blood sugar readings with you to your follow up appointment with primary care.    Please call and make your follow up appointments as soon as possible.    Also Note the following: If you  experience worsening of your admission symptoms, develop shortness of breath, life threatening emergency, suicidal or homicidal thoughts you must seek medical attention immediately by calling 911 or calling your MD immediately  if symptoms less severe.  You must read complete instructions/literature along with all the possible adverse reactions/side effects for all the Medicines you take and that have been prescribed to you. Take any new Medicines after you have completely understood and accpet all the possible adverse reactions/side effects.   Do not drive when taking Pain medications or sleeping medications (Benzodiazepines)  Do not take more than prescribed Pain, Sleep and Anxiety Medications. It is not advisable to combine anxiety,sleep and pain medications without talking with your primary care practitioner  Special Instructions: If you have smoked or chewed Tobacco  in the last 2 yrs please stop smoking, stop any regular Alcohol  and or any Recreational drug use.  Wear Seat belts while driving.  Do not drive if taking any narcotic, mind altering or controlled substances or recreational drugs or alcohol.       

## 2021-04-29 NOTE — Consult Note (Signed)
Cardiology Consultation:   Patient ID: Caitlyn Irwin MRN: [id number]; DOB: May 05, 1976  Admit date: 04/28/2021 Date of Consult: 04/29/2021  PCP:  Oneita Hurt, No   [facility] Providers Cardiologist: New to [facility]  Patient Profile:   Caitlyn Irwin is a 45 y.o. female with a past medical history of COPD, depression, Bipolar Disorder, chronic pain and history of seizures in 2015 (occurring in the setting of drug use by review of notes) who is being seen today for the evaluation of chest pain at the request of Dr. Carren Rang.  History of Present Illness:   [name] presented to [facility] ED on 04/28/2021 for evaluation of intermittent chest pain over the past 2 months. In talking with the patient today, she reports having sporadic episodes of chest pressure for the past few months and her pain can last throughout the day and sometimes up to 3 days. Pain is typically not exacerbated with exertion or positional changes.  She has been under increased stress over the past few months as her fianc passed away from cancer 2 months ago. Since then, she has gone back to work and reports feeling under stress with this along with the emotional stress of his passing. Yesterday while at work, she developed worsening chest pressure with associated dyspnea and diaphoresis. Also reports having nausea and diarrhea for the past several days as well. Her chest pressure started around noon and lasted until late last night. She denies any recurrent pain this morning.  She is unaware of any personal history of CAD, CHF or cardiac arrhythmias. Not aware of any prior diagnoses of HTN, HLD or Type II DM but says it has been a few years since she was evaluated by a PCP. She does smoke less than 0.5 ppd. Reports her mother died of a heart attack at age 26.  Initial labs show WBC 7.3, Hgb 13.9, platelets 254, Na+ 138, K+ 2.7 and creatinine 0.70. D-dimer 0.52. Initial Hs Troponin negative at 5 with repeat at 4. Mg 2.1.  COVID negative. CXR showing cardiomegaly with no acute findings. CTA showed no evidence of a PE. Heart size listed as being normal and no mention of coronary calcifications. Was noted to have a small hiatal hernia. EKG shows sinus bradycardia, HR 52 with PAC's and no acute ST changes.    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 Authorizing Provider  aspirin 81 MG chewable tablet Chew 81 mg by mouth daily as needed.    [provider]  Aspirin-Salicylamide-Caffeine (BC HEADACHE POWDER PO) Take by mouth.    [provider]  citalopram (CELEXA) 20 MG tablet Take 1 tablet (20 mg total) by mouth daily. 03/06/19   Jacquelin [facility], PA-C  Ibuprofen (IBU PO) Take 2 tablets by mouth 3 times/day as needed-between meals & bedtime.    [provider]  Ibuprofen-diphenhydrAMINE Cit (ADVIL PM PO) Take by mouth.    [provider]    Inpatient  Medications: Scheduled Meds:  ARIPiprazole  10 mg Oral Daily   aspirin  81 mg Oral Daily   buprenorphine  8 mg Sublingual Daily   heparin  5,000 Units Subcutaneous Q8H   QUEtiapine  50 mg Oral QHS   Continuous Infusions:  PRN Meds: acetaminophen, ALPRAZolam, nitroGLYCERIN, ondansetron (ZOFRAN) IV  Allergies:   No Known Allergies  Social History:   Social History   Socioeconomic History   Marital status: Divorced    Spouse name: Not on file   Number of children: Not on file   Years of education: Not on file   Highest education level: Not on file  Occupational History   Not on  file  Tobacco Use   Smoking status: Some Days    Packs/day: 1.00    Years: 30.00    Pack years: 30.00    Types: Cigarettes    Start date: 09/11/1988   Smokeless tobacco: Never  Vaping Use   Vaping Use: Never used  Substance and Sexual Activity   Alcohol use: No    Alcohol/week: 0.0 standard drinks    Comment: beer in the past.  none since 2010   Drug use: Not Currently    Types: Cocaine, Marijuana, Heroin, Methamphetamines    Comment: last drug use 2015   Sexual activity: Yes    Birth control/protection: Surgical    Comment: "my tubes is tied"  Other Topics Concern   Not on file  Social History Narrative   Not on file   Social Determinants of Health   Financial Resource Strain: Not on file  Food Insecurity: Not on file  Transportation Needs: Not on file  Physical Activity: Not on file  Stress: Not on file  Social Connections: Not on file  Intimate Partner Violence: Not on file    Family History:    Family History  Problem Relation Age of Onset   Diabetes Mother    Heart attack Mother    Heart disease Mother    Anxiety disorder Mother    Hypertension Father      ROS:  Please see the history of present illness.   All other ROS reviewed and negative.     Physical Exam/Data:   Vitals:   04/29/21 0310 04/29/21 0340 04/29/21 0400 04/29/21 0629  BP: 112/64 100/66 107/65 105/69  Pulse: 67 (!) 41 (!) 43 (!) 45  Resp: (!) 23 11 12 18   Temp:    97.8 F (36.6 C)  TempSrc:    Oral  SpO2: 96% 91% 94% 96%  Weight:      Height:        Intake/Output Summary (Last 24 hours) at 04/29/2021 0859 Last data filed at 04/29/2021 0456 Gross per 24 hour  Intake 100 ml  Output --  Net 100 ml   Last 3 Weights 04/28/2021 11/20/2018 11/06/2018  Weight (lbs) 223 lb 226 lb 226 lb 8 oz  Weight (kg) 101.152 kg 102.513 kg 102.74 kg  Some encounter information is confidential and restricted. Go to Review Flowsheets activity to see all data.     Body mass index is 34.93 kg/m.   General:  Well nourished, well developed female appearing in no acute distress. HEENT: normal Lymph: no adenopathy Neck: no JVD Endocrine:  No thryomegaly Vascular: No carotid bruits; FA pulses 2+ bilaterally without bruits  Cardiac:  normal S1, S2; RRR; no murmur. Lungs:  clear to auscultation bilaterally, no wheezing, rhonchi or rales  Abd: soft, nontender, no hepatomegaly  Ext: no pitting edema  Musculoskeletal:  No deformities, BUE and BLE strength normal and equal Skin: warm and dry  Neuro:  CNs 2-12 intact, no focal abnormalities noted Psych:  Normal affect   EKG:  The EKG was personally reviewed and demonstrates: Sinus bradycardia, HR 52 with PAC's and no acute ST changes.   Telemetry:  Telemetry was personally reviewed and demonstrates: Sinus bradycardia, HR in 40's to 50's with occasional PAC's. No high-grade AV block.   Relevant CV Studies:  None on File  Laboratory Data:  High Sensitivity Troponin:   Recent Labs  Lab 04/28/21 2215 04/29/21 0027  TROPONINIHS 5 4     Chemistry Recent Labs  Lab 04/28/21 2215  NA 138  K 2.7*  CL 102  CO2 26  GLUCOSE 106*  BUN 8  CREATININE 0.70  CALCIUM 8.9  GFRNONAA >60  ANIONGAP 10    Recent Labs  Lab 04/28/21 2215  PROT 7.3  ALBUMIN 3.7  AST 25  ALT 20  ALKPHOS 55  BILITOT 0.8   Hematology Recent Labs  Lab 04/28/21 2215  WBC 7.3  RBC 5.08  HGB 13.9  HCT 43.0  MCV 84.6  MCH 27.4  MCHC 32.3  RDW 15.5  PLT 254   BNPNo results for input(s): BNP, PROBNP in the last 168 hours.  DDimer  Recent Labs  Lab 04/28/21 2215  DDIMER 0.52*     Radiology/Studies:  CT Angio Chest PE W and/or Wo Contrast  Result Date: 04/29/2021 CLINICAL DATA:  Intermittent chest pain, vomiting, elevated D-dimer EXAM: CT ANGIOGRAPHY CHEST WITH CONTRAST TECHNIQUE: Multidetector CT imaging of the chest was performed using the standard protocol during bolus administration of intravenous contrast. Multiplanar CT image  reconstructions and MIPs were obtained to evaluate the vascular anatomy. CONTRAST:  100mL OMNIPAQUE IOHEXOL 350 MG/ML SOLN COMPARISON:  None. FINDINGS: Cardiovascular: Satisfactory opacification of the pulmonary arteries to the segmental level. No evidence of pulmonary embolism. Normal heart size. No pericardial effusion. Mediastinum/Nodes: The visualized thyroid is unremarkable. No pathologic thoracic adenopathy. The esophagus is unremarkable. Small hiatal hernia. Lungs/Pleura: Lungs are clear. No pleural effusion or pneumothorax. Upper Abdomen: No acute abnormality. Musculoskeletal: No chest wall abnormality. No acute or significant osseous findings. Review of the MIP images confirms the above findings. IMPRESSION: No pulmonary embolism.  No acute intrathoracic pathology identified. Small hiatal hernia. Electronically Signed   By: Helyn NumbersAshesh  Parikh MD   On: 04/29/2021 00:23   DG Chest Port 1 View  Result Date: 04/28/2021 CLINICAL DATA:  Chest pain. Intermittent chest pain for 2 months, worse today. EXAM: PORTABLE CHEST 1 VIEW COMPARISON:  Radiograph 09/15/2014 FINDINGS: Upper normal heart size, likely accentuated by portable technique.The cardiomediastinal contours are normal. The lungs are clear. Pulmonary vasculature is normal. No consolidation, pleural effusion, or pneumothorax. Remote right second rib fracture. No acute osseous abnormalities are seen. IMPRESSION: Upper normal heart size, likely accentuated by portable technique. No acute chest findings. Electronically Signed   By: Narda RutherfordMelanie  Sanford M.D.   On: 04/28/2021 22:45     Assessment and Plan:   1. Chest Pain with Atypical Features - Her episodes of chest pain seem atypical as they can last for hours to days at a time and are not worse with exertion. She has been under increased stress given the recent passing of her fianc and suspect this could be contributing as well. She would be at risk for stress-induced cardiomyopathy but denies any  specific orthopnea, PND or pitting edema.  - D-dimer was slightly elevated at 0.52 and  CTA was negative for a PE. Initial Hs Troponin negative at 5 with repeat at 4. EKG shows sinus bradycardia, HR 52 with PAC's and no acute ST changes.  - Will order an echocardiogram to assess for any structural abnormalities. If reassuring, would consider an outpatient Lexiscan Myoview or Coronary CT (she had caffeine this AM so not sure a Lexiscan could be performed this morning) given her cardiac risk factors. Will check Hgb A1c and FLP for risk stratification.   2. Hypokalemia - K+ was at 2.7 on admission and she reports a history of diarrhea, nausea and vomiting for several days. She did receive supplementation on admission. Will recheck with AM labs.   3. Sinus Bradycardia - HR was in the 40's at times overnight and is now in the 50's. No evidence of high-grade AV Block. Continue to avoid AV nodal blocking agents. Will recheck K+ along with checking TSH. If this persists, would recommend an outpatient sleep study to assess for OSA.   4. Bipolar Disorder/Chronic Pain - She has been continued on PTA Abilify, Seroquel and Subutex by the admitting team.    Risk Assessment/Risk Scores:     HEAR Score (for undifferentiated chest pain):  HEAR Score: 5  For questions or updates, please contact [facility] Please consult [email or web address] for contact info under    Signed, Ellsworth Lennox, PA-C  04/29/2021 8:59 AM

## 2021-04-29 NOTE — Discharge Summary (Signed)
Physician Discharge Summary  Caitlyn Irwin:[id number] DOB: 23-Jul-1976 DOA: 04/28/2021   Admit date: 04/28/2021 Discharge date: 04/29/2021  Admitted From:  HOME  Disposition: HOME   Recommendations for Outpatient Follow-up:  Follow up with PCP in 1 weeks Follow up with cardiology team as scheduled on 05/11/21 in [place] office   Discharge Condition: STABLE   CODE STATUS: FULL DIET: Heart Healthy    Brief Hospitalization Summary: Please see all hospital notes, images, labs for full details of the hospitalization. ADMISSION HPI:  45 y.o. female, with history of anxiety, bipolar 1 disorder, COPD, GERD, and more presents the ED with a chief complaint of chest pain.  Patient reports that she has had intermittent chest pain for last 2 months.  Today it got much worse.  She reports that she was exerting herself-by working the grill-when she suddenly had chest pain just left of her sternum.  The pain was medial to the midclavicular line.  She reports that she had diaphoresis, nausea, dyspnea.  She had associated palpitations.  No radiation of the pain.  Pain goes away with rest, worse with exertion.  She does have a family history with her mother dying of an MI at the age of 66.  Patient reports no personal history of heart disease.  She does report that she does not have a PCP so she is not able to follow her health care very well.  She is a current smoker, and she is obese.  Of note patient has chronic pain for which she takes Subutex.  The Subutex has not helped with this pain.  Patient does report a decreased appetite which she attributes to depression   Patient is not vaccinated for COVID and the patient is full code.  She is a current smoker.   In the ED Temp 98.1, heart rate 37/67, respiratory 10-23, blood pressure 112/64, O2 sats 96% Troponin stable at 4 and 5 No leukocytosis white blood cell count of 7.3, hemoglobin 13.9 Chemistry panel reveals a hypokalemia at 2.7 CT angio shows no  PE Chest x-ray shows borderline cardiomegaly EKG shows heart rate 52, sinus rhythm, QTC 444 ED provider spoke with the cardiology fellow who recommended admission and any plan and inpatient cardiology consulted.    HOSPITAL COURSE Patient was placed in observation for chest pain rule out.  She was restarted on her home medications for pain and behavioral health.  An inpatient cardiology consultation was requested and they saw her this morning and ordered a 2D echocardiogram which was completed prior to discharge with findings that were reassuring with no regional wall motion abnormalities.  Full report below.  Her LDL and total cholesterol noted to be significantly elevated and has been started on atorvastatin 40 mg daily.  In addition after speaking with cardiology today the decision was made for her to follow-up outpatient for further consideration of outpatient work-up.  No further inpatient work-up needed at this time.  Her at HS troponin tests have been negative.  She is stable to discharge home to have outpatient follow-up with cardiology on 05/11/2021 in the [place] office as scheduled.  Pt was counseled on tobacco cessation and given a nicotine patch to use in hospital.    Discharge Diagnoses:  Active Problems:   Chest pain   Hyperlipidemia   Bipolar disorder (HCC)   Severe anxiety   COPD (chronic obstructive pulmonary disease) (HCC)   Discharge Instructions:  Allergies as of 04/29/2021   No Known Allergies  Medication List     STOP taking these medications    citalopram 20 MG tablet Commonly known as: CELEXA       TAKE these medications    ALPRAZolam 1 MG tablet Commonly known as: XANAX Take 1 mg by mouth 2 (two) times daily.   ARIPiprazole 10 MG tablet Commonly known as: ABILIFY Take 10 mg by mouth daily.   aspirin 81 MG chewable tablet Chew 1 tablet (81 mg total) by mouth daily. What changed:  when to take this reasons to take this   atorvastatin 40 MG  tablet Commonly known as: LIPITOR Take 1 tablet (40 mg total) by mouth every evening.   buprenorphine 8 MG Subl SL tablet Commonly known as: SUBUTEX Place 8 mg under the tongue 3 (three) times daily.   prazosin 2 MG capsule Commonly known as: MINIPRESS Take 2 mg by mouth at bedtime.   QUEtiapine 50 MG tablet Commonly known as: SEROQUEL Take 50-100 mg by mouth at bedtime.        Follow-up Information     Netta Neat., NP Follow up on 05/11/2021.   Specialty: Cardiology Why: Cardiology Hospital Follow-up on 05/11/2021 at 10:30 AM in the [facility]. Contact information: [address] [place] [address] [phone]         Primary care provider. Schedule an appointment as soon as possible for a visit in 1 week(s).   Why: Hospital Follow Up               No Known Allergies Allergies as of 04/29/2021   No Known Allergies      Medication List     STOP taking these medications    citalopram 20 MG tablet Commonly known as: CELEXA       TAKE these medications    ALPRAZolam 1 MG tablet Commonly known as: XANAX Take 1 mg by mouth 2 (two) times daily.   ARIPiprazole 10 MG tablet Commonly known as: ABILIFY Take 10 mg by mouth daily.   aspirin 81 MG chewable tablet Chew 1 tablet (81 mg total) by mouth daily. What changed:  when to take this reasons to take this   atorvastatin 40 MG tablet Commonly known as: LIPITOR Take 1 tablet (40 mg total) by mouth every evening.   buprenorphine 8 MG Subl SL tablet Commonly known as: SUBUTEX Place 8 mg under the tongue 3 (three) times daily.   prazosin 2 MG capsule Commonly known as: MINIPRESS Take 2 mg by mouth at bedtime.   QUEtiapine 50 MG tablet Commonly known as: SEROQUEL Take 50-100 mg by mouth at bedtime.        Procedures/Studies: CT Angio Chest PE W and/or Wo Contrast  Result Date: 04/29/2021 CLINICAL DATA:  Intermittent chest pain, vomiting, elevated D-dimer EXAM: CT  ANGIOGRAPHY CHEST WITH CONTRAST TECHNIQUE: Multidetector CT imaging of the chest was performed using the standard protocol during bolus administration of intravenous contrast. Multiplanar CT image reconstructions and MIPs were obtained to evaluate the vascular anatomy. CONTRAST:  OMNIPAQUE IOHEXOL 350 MG/ML SOLN COMPARISON:  None. FINDINGS: Cardiovascular: Satisfactory opacification of the pulmonary arteries to the segmental level. No evidence of pulmonary embolism. Normal heart size. No pericardial effusion. Mediastinum/Nodes: The visualized thyroid is unremarkable. No pathologic thoracic adenopathy. The esophagus is unremarkable. Small hiatal hernia. Lungs/Pleura: Lungs are clear. No pleural effusion or pneumothorax. Upper Abdomen: No acute abnormality. Musculoskeletal: No chest wall abnormality. No acute or significant osseous findings. Review of the MIP images  confirms the above findings. IMPRESSION: No pulmonary embolism.  No acute intrathoracic pathology identified. Small hiatal hernia. Electronically Signed   By: Helyn Numbers MD   On: 04/29/2021 00:23   DG Chest Port 1 View  Result Date: 04/28/2021 CLINICAL DATA:  Chest pain. Intermittent chest pain for 2 months, worse today. EXAM: PORTABLE CHEST 1 VIEW COMPARISON:  Radiograph 09/15/2014 FINDINGS: Upper normal heart size, likely accentuated by portable technique.The cardiomediastinal contours are normal. The lungs are clear. Pulmonary vasculature is normal. No consolidation, pleural effusion, or pneumothorax. Remote right second rib fracture. No acute osseous abnormalities are seen. IMPRESSION: Upper normal heart size, likely accentuated by portable technique. No acute chest findings. Electronically Signed   By: Narda Rutherford M.D.   On: 04/28/2021 22:45   ECHOCARDIOGRAM COMPLETE  Result Date: 04/29/2021    ECHOCARDIOGRAM REPORT   Patient Name:   Caitlyn Irwin Date of Exam: 04/29/2021 Medical Rec #:  [id number]         Height:       67.0  in Accession #:    [id number]        Weight:       223.0 lb Date of Birth:  1976-07-12         BSA:          2.118 m Patient Age:    45 years          BP:           105/69 mmHg Patient Gender: F                 HR:           45 bpm. [facility] Procedure: 2D Echo, Cardiac Doppler, Color Doppler and Intracardiac            Opacification Agent Indications:    Chest pain  History:        Patient has no prior history of Echocardiogram examinations.                 COPD, Signs/Symptoms:Chest Pain; Risk Factors:Current Smoker and                 Obesity.  Sonographer:    Lavenia Atlas RDCS Referring Phys: [id number] Ellsworth Lennox  Sonographer Comments: Patient is morbidly obese and Technically difficult study due to poor echo windows. IMPRESSIONS  1. Left ventricular ejection fraction, by estimation, is 60 to 65%. The left ventricle has normal function. The left ventricle has no regional wall motion abnormalities. There is mild left ventricular hypertrophy. Left ventricular diastolic parameters were normal.  2. Right ventricular systolic function is normal. The right ventricular size is normal. There is normal pulmonary artery systolic pressure. The estimated right ventricular systolic pressure is 23.2 mmHg.  3. The mitral valve is grossly normal. Trivial mitral valve regurgitation.  4. The aortic valve is tricuspid. Aortic valve regurgitation is not visualized.  5. The inferior vena cava is normal in size with greater than 50% respiratory variability, suggesting right atrial pressure of 3 mmHg. FINDINGS  Left Ventricle: Left ventricular ejection fraction, by estimation, is 60 to 65%. The left ventricle has normal function. The left ventricle has no regional wall motion abnormalities. Definity contrast agent was given IV to delineate the left ventricular  endocardial borders. The left ventricular internal cavity size was normal in size. There is mild left ventricular hypertrophy. Left ventricular  diastolic parameters were normal. Right Ventricle: The right ventricular size is normal.  No increase in right ventricular wall thickness. Right ventricular systolic function is normal. There is normal pulmonary artery systolic pressure. The tricuspid regurgitant velocity is 2.25 m/s, and  with an assumed right atrial pressure of 3 mmHg, the estimated right ventricular systolic pressure is 23.2 mmHg. Left Atrium: Left atrial size was normal in size. Right Atrium: Right atrial size was normal in size. Pericardium: There is no evidence of pericardial effusion. Mitral Valve: The mitral valve is grossly normal. Trivial mitral valve regurgitation. Tricuspid Valve: The tricuspid valve is grossly normal. Tricuspid valve regurgitation is trivial. Aortic Valve: The aortic valve is tricuspid. There is mild aortic valve annular calcification. Aortic valve regurgitation is not visualized. Pulmonic Valve: The pulmonic valve was grossly normal. Pulmonic valve regurgitation is not visualized. Aorta: The aortic root is normal in size and structure. Venous: The inferior vena cava is normal in size with greater than 50% respiratory variability, suggesting right atrial pressure of 3 mmHg. IAS/Shunts: No atrial level shunt detected by color flow Doppler.  LEFT VENTRICLE PLAX 2D LVIDd:         4.64 cm  Diastology LVIDs:         3.09 cm  LV e' medial:    11.30 cm/s LV PW:         1.11 cm  LV E/e' medial:  8.1 LV IVS:        1.08 cm  LV e' lateral:   13.80 cm/s LVOT diam:     2.10 cm  LV E/e' lateral: 6.6 LV SV:         107 LV SV Index:   50 LVOT Area:     3.46 cm  RIGHT VENTRICLE RV Basal diam:  2.55 cm RV S prime:     8.15 cm/s LEFT ATRIUM             Index       RIGHT ATRIUM           Index LA diam:        3.50 cm 1.65 cm/m  RA Area:     13.30 cm LA Vol (A2C):   44.5 ml 21.01 ml/m RA Volume:   31.60 ml  14.92 ml/m LA Vol (A4C):   50.0 ml 23.61 ml/m LA Biplane Vol: 47.1 ml 22.24 ml/m  AORTIC VALVE LVOT Vmax:   115.00 cm/s LVOT  Vmean:  75.500 cm/s LVOT VTI:    0.308 m  AORTA Ao Root diam: 2.90 cm MITRAL VALVE               TRICUSPID VALVE MV Area (PHT): 2.46 cm    TR Peak grad:   20.2 mmHg MV Decel Time: 309 msec    TR Vmax:        225.00 cm/s MV E velocity: 91.00 cm/s MV A velocity: 47.90 cm/s  SHUNTS MV E/A ratio:  1.90        Systemic VTI:  0.31 m                            Systemic Diam: 2.10 cm Nona Dell MD Electronically signed by Nona Dell MD Signature Date/Time: 04/29/2021/11:34:29 AM    Final      Subjective: Pt reports that she is feeling a lot better today, the chest pain is not as intense.  NO SOB.  She is eating and drinking well.    Discharge Exam: Vitals:   04/29/21 0400 04/29/21 0629  BP:  107/65 105/69  Pulse: (!) 43 (!) 45  Resp: 12 18  Temp:  97.8 F (36.6 C)  SpO2: 94% 96%   Vitals:   04/29/21 0310 04/29/21 0340 04/29/21 0400 04/29/21 0629  BP: 112/64 100/66 107/65 105/69  Pulse: 67 (!) 41 (!) 43 (!) 45  Resp: (!) Temp:    97.8 F (36.6 C)  TempSrc:    Oral  SpO2: 96% 91% 94% 96%  Weight:      Height:       General: Pt is alert, awake, not in acute distress Cardiovascular: normal S1/S2 +, no rubs, no gallops Respiratory: CTA bilaterally, no wheezing, no rhonchi Abdominal: Soft, NT, ND, bowel sounds + Extremities: no edema, no cyanosis Neurological Nonfocal exam.    The results of significant diagnostics from this hospitalization (including imaging, microbiology, ancillary and laboratory) are listed below for reference.     Microbiology: Recent Results (from the past 240 hour(s))  Resp Panel by RT-PCR (Flu A&B, Covid) Nasopharyngeal Swab     Status: None   Collection Time: 04/29/21  3:26 AM   Specimen: Nasopharyngeal Swab; Nasopharyngeal(NP) swabs in vial transport medium  Result Value Ref Range Status   SARS Coronavirus 2 by RT PCR NEGATIVE NEGATIVE Final    Comment: (NOTE) SARS-CoV-2 target nucleic acids are NOT DETECTED.  The SARS-CoV-2 RNA is  generally detectable in upper respiratory specimens during the acute phase of infection. The lowest concentration of SARS-CoV-2 viral copies this assay can detect is 138 copies/mL. A negative result does not preclude SARS-Cov-2 infection and should not be used as the sole basis for treatment or other patient management decisions. A negative result may occur with  improper specimen collection/handling, submission of specimen other than nasopharyngeal swab, presence of viral mutation(s) within the areas targeted by this assay, and inadequate number of viral copies(<138 copies/mL). A negative result must be combined with clinical observations, patient history, and epidemiological information. The expected result is Negative.  Fact Sheet for Patients:  BloggerCourse.com  Fact Sheet for Healthcare Providers:  SeriousBroker.it  This test is no t yet approved or cleared by the [place] FDA and  has been authorized for detection and/or diagnosis of SARS-CoV-2 by FDA under an Emergency Use Authorization (EUA). This EUA will remain  in effect (meaning this test can be used) for the duration of the COVID-19 declaration under Section 564(b)(1) of the Act, 21 U.S.C.section 360bbb-3(b)(1), unless the authorization is terminated  or revoked sooner.       Influenza A by PCR NEGATIVE NEGATIVE Final   Influenza B by PCR NEGATIVE NEGATIVE Final    Comment: (NOTE) The Xpert Xpress SARS-CoV-2/FLU/RSV plus assay is intended as an aid in the diagnosis of influenza from Nasopharyngeal swab specimens and should not be used as a sole basis for treatment. Nasal washings and aspirates are unacceptable for Xpert Xpress SARS-CoV-2/FLU/RSV testing.  Fact Sheet for Patients: BloggerCourse.com  Fact Sheet for Healthcare Providers: SeriousBroker.it  This test is not yet approved or cleared by the [place] FDA and has been authorized for detection and/or diagnosis of SARS-CoV-2 by FDA under an Emergency Use Authorization (EUA). This EUA will remain in effect (meaning this test can be used) for the duration of the COVID-19 declaration under Section 564(b)(1) of the Act, 21 U.S.C. section 360bbb-3(b)(1), unless the authorization is terminated or revoked.  Performed at [facility], [address], [place], [place] [address]      Labs: BNP (last 3 results)  No results for input(s): BNP in the last 8760 hours. Basic Metabolic Panel: Recent Labs  Lab 04/28/21 2215 04/29/21 0027 04/29/21 0855  NA 138  --  137  K 2.7*  --  3.8  CL 102  --  105  CO2 26  --  26  GLUCOSE 106*  --  128*  BUN 8  --  8  CREATININE 0.70  --  0.70  CALCIUM 8.9  --  8.6*  MG  --  2.1  --    Liver Function Tests: Recent Labs  Lab 04/28/21 2215 04/29/21 0855  AST 25 21  ALT 20 20  ALKPHOS 55 50  BILITOT 0.8 1.0  PROT 7.3 6.8  ALBUMIN 3.7 3.5   No results for input(s): LIPASE, AMYLASE in the last 168 hours. No results for input(s): AMMONIA in the last 168 hours. CBC: Recent Labs  Lab 04/28/21 2215  WBC 7.3  HGB 13.9  HCT 43.0  MCV 84.6  PLT 254   Cardiac Enzymes: No results for input(s): CKTOTAL, CKMB, CKMBINDEX, TROPONINI in the last 168 hours. BNP: Invalid input(s): POCBNP CBG: No results for input(s): GLUCAP in the last 168 hours. D-Dimer Recent Labs    04/28/21 2215  DDIMER 0.52*   Hgb A1c No results for input(s): HGBA1C in the last 72 hours. Lipid Profile Recent Labs    04/29/21 0855  CHOL 236*  HDL 33*  LDLCALC 182*  TRIG 103  CHOLHDL 7.2   Thyroid function studies Recent Labs    04/29/21 0855  TSH 1.091   Anemia work up No results for input(s): VITAMINB12, FOLATE, FERRITIN, TIBC, IRON, RETICCTPCT in the last 72 hours. Urinalysis    Component Value Date/Time   COLORURINE YELLOW 11/19/2007 0931   APPEARANCEUR CLOUDY (A) 11/19/2007 0931   LABSPEC 1.010  11/19/2007 0931   PHURINE 7.0 11/19/2007 0931   GLUCOSEU NEGATIVE 11/19/2007 0931   HGBUR SMALL (A) 11/19/2007 0931   BILIRUBINUR NEGATIVE 11/19/2007 0931   KETONESUR NEGATIVE 11/19/2007 0931   PROTEINUR NEGATIVE 11/19/2007 0931   UROBILINOGEN 0.2 11/19/2007 0931   NITRITE POSITIVE (A) 11/19/2007 0931   LEUKOCYTESUR TRACE (A) 11/19/2007 0931   Sepsis Labs Invalid input(s): PROCALCITONIN,  WBC,  LACTICIDVEN Microbiology Recent Results (from the past 240 hour(s))  Resp Panel by RT-PCR (Flu A&B, Covid) Nasopharyngeal Swab     Status: None   Collection Time: 04/29/21  3:26 AM   Specimen: Nasopharyngeal Swab; Nasopharyngeal(NP) swabs in vial transport medium  Result Value Ref Range Status   SARS Coronavirus 2 by RT PCR NEGATIVE NEGATIVE Final    Comment: (NOTE) SARS-CoV-2 target nucleic acids are NOT DETECTED.  The SARS-CoV-2 RNA is generally detectable in upper respiratory specimens during the acute phase of infection. The lowest concentration of SARS-CoV-2 viral copies this assay can detect is 138 copies/mL. A negative result does not preclude SARS-Cov-2 infection and should not be used as the sole basis for treatment or other patient management decisions. A negative result may occur with  improper specimen collection/handling, submission of specimen other than nasopharyngeal swab, presence of viral mutation(s) within the areas targeted by this assay, and inadequate number of viral copies(<138 copies/mL). A negative result must be combined with clinical observations, patient history, and epidemiological information. The expected result is Negative.  Fact Sheet for Patients:  BloggerCourse.com  Fact Sheet for Healthcare Providers:  SeriousBroker.it  This test is no t yet approved or cleared by the [place] FDA and  has been authorized for detection and/or  diagnosis of SARS-CoV-2 by FDA under an Emergency Use Authorization  (EUA). This EUA will remain  in effect (meaning this test can be used) for the duration of the COVID-19 declaration under Section 564(b)(1) of the Act, 21 U.S.C.section 360bbb-3(b)(1), unless the authorization is terminated  or revoked sooner.       Influenza A by PCR NEGATIVE NEGATIVE Final   Influenza B by PCR NEGATIVE NEGATIVE Final    Comment: (NOTE) The Xpert Xpress SARS-CoV-2/FLU/RSV plus assay is intended as an aid in the diagnosis of influenza from Nasopharyngeal swab specimens and should not be used as a sole basis for treatment. Nasal washings and aspirates are unacceptable for Xpert Xpress SARS-CoV-2/FLU/RSV testing.  Fact Sheet for Patients: BloggerCourse.comhttps://www.fda.gov/media/152166/download  Fact Sheet for Healthcare Providers: SeriousBroker.ithttps://www.fda.gov/media/152162/download  This test is not yet approved or cleared by the [place] FDA and has been authorized for detection and/or diagnosis of SARS-CoV-2 by FDA under an Emergency Use Authorization (EUA). This EUA will remain in effect (meaning this test can be used) for the duration of the COVID-19 declaration under Section 564(b)(1) of the Act, 21 U.S.C. section 360bbb-3(b)(1), unless the authorization is terminated or revoked.  Performed at [facility], [address] [place], [place] [address]    Time coordinating discharge: 40 mins   SIGNED:  Standley Dakinslanford Kelcy Baeten, MD  [facility] 04/29/2021, 12:28 PM How to contact the [facility] Attending or Consulting provider 7A - 7P or covering provider during after hours 7P -7A, for this patient?  Check the care team in [facility] and look for a) attending/consulting [facility] provider listed and b) the [facility] team listed Log into [email or web address] and use [place]'s universal password to access. If you do not have the password, please contact the hospital operator. Locate the [facility] provider you are looking for under [facility] and page to a number that you can be directly reached. If you  still have difficulty reaching the provider, please page the [facility] (Director on Call) for the Hospitalists listed on amion for assistance.

## 2021-04-29 NOTE — H&P (Signed)
TRH H&P    Patient Demographics:    Caitlyn ClicheShana Scrima, is a 45 y.o. female  MRN: [id number]  DOB - [birth year]  Admit Date - 04/28/2021  Referring MD/NP/PA: [place]  Outpatient Primary MD for the patient is Pcp, No  Patient coming from: Home  Chief complaint- Chest pain   HPI:    Caitlyn Irwin  is a 45 y.o. female, with history of anxiety, bipolar 1 disorder, COPD, GERD, and more presents the ED with a chief complaint of chest pain.  Patient reports that she has had intermittent chest pain for last 2 months.  Today it got much worse.  She reports that she was exerting herself-by working the grill-when she suddenly had chest pain just left of her sternum.  The pain was medial to the midclavicular line.  She reports that she had diaphoresis, nausea, dyspnea.  She had associated palpitations.  No radiation of the pain.  Pain goes away with rest, worse with exertion.  She does have a family history with her mother dying of an MI at the age of 46.  Patient reports no personal history of heart disease.  She does report that she does not have a PCP so she is not able to follow her health care very well.  She is a current smoker, and she is obese.  Of note patient has chronic pain for which she takes Subutex.  The Subutex has not helped with this pain.  Patient does report a decreased appetite which she attributes to depression  Patient is not vaccinated for COVID and the patient is full code.  She is a current smoker.  In the ED Temp 98.1, heart rate 37/67, respiratory 10-23, blood pressure 112/64, O2 sats 96% Troponin stable at 4 and 5 No leukocytosis white blood cell count of 7.3, hemoglobin 13.9 Chemistry panel reveals a hypokalemia at 2.7 CT angio shows no PE Chest x-ray shows borderline cardiomegaly EKG shows heart rate 52, sinus rhythm, QTC 444 ED provider spoke with the cardiology fellow who recommended admission  and any plan and inpatient cardiology consult in the a.m.   Review of systems:    In addition to the HPI above,  No Fever-chills, No Headache, No changes with Vision or hearing, No problems swallowing food or Liquids, Admits to chest pain and dyspnea No Abdominal pain, No Nausea or Vomiting, bowel movements are regular, No Blood in stool or Urine, No dysuria, No new skin rashes or bruises, No new joints pains-aches,  No new weakness, tingling, numbness in any extremity, No recent weight gain or loss, No polyuria, polydypsia or polyphagia, No significant Mental Stressors.  All other systems reviewed and are negative.    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  none since 2010       Family History :     Family History  Problem Relation Age of Onset   Diabetes Mother    Heart attack Mother    Heart disease Mother    Anxiety disorder Mother    Hypertension Father       Home Medications:   Prior to Admission medications   Medication Sig Start Date End Date Taking? Authorizing Provider  aspirin 81 MG chewable tablet Chew 81 mg by mouth daily as needed.    [provider]  Aspirin-Salicylamide-Caffeine (BC HEADACHE POWDER PO) Take by mouth.    [provider]  citalopram (CELEXA) 20 MG tablet Take 1 tablet (20 mg total) by mouth daily. 03/06/19   Jacquelin Hawking, PA-C  Ibuprofen (IBU PO) Take 2 tablets by mouth 3 times/day as needed-between meals & bedtime.    [provider]  Ibuprofen-diphenhydrAMINE Cit (ADVIL PM PO) Take by mouth.    [provider]     Allergies:    No Known Allergies   Physical Exam:   Vitals  Blood pressure 107/65, pulse (!) 43, temperature 98.3 F (36.8 C), temperature source Oral, resp. rate 12, height 5\' 7"  (1.702 m), weight 101.2 kg, last menstrual period 04/21/2021, SpO2 94 %.  1.  General: Patient lying supine in bed,  no acute distress   2. Psychiatric: Alert and oriented x 3, flat affect and behavior normal for situation, pleasant and cooperative with exam   3. Neurologic: Speech and language are normal, face is symmetric, moves all 4 extremities voluntarily, at baseline without acute deficits on limited exam   4. HEENMT:  Head is atraumatic, normocephalic, pupils reactive to light, neck is supple, trachea is midline, mucous membranes are moist   5. Respiratory : Lungs are clear to auscultation bilaterally without wheezing, rhonchi, rales, no cyanosis, no increase in work of breathing or accessory muscle use   6. Cardiovascular : Heart rate bradycardic, rhythm is regular, no murmurs, rubs or gallops, no peripheral edema, peripheral pulses palpated   7. Gastrointestinal:  Abdomen is soft, nondistended, nontender to palpation bowel sounds active, no masses or organomegaly palpated   8. Skin:  Skin is warm, dry and intact without rashes, acute lesions, or ulcers on limited exam   9.Musculoskeletal:  No acute deformities or trauma, no asymmetry in tone, no peripheral edema, peripheral pulses palpated, no tenderness to palpation in the extremities     Data Review:    CBC Recent Labs  Lab 04/28/21 2215  WBC 7.3  HGB 13.9  HCT 43.0  PLT 254  MCV 84.6  MCH  27.4  MCHC 32.3  RDW 15.5   ------------------------------------------------------------------------------------------------------------------  Results for orders placed or performed during the hospital encounter of 04/28/21 (from the past 48 hour(s))  Basic metabolic panel     Status:  Abnormal   Collection Time: 04/28/21 10:15 PM  Result Value Ref Range   Sodium 138 135 - 145 mmol/L   Potassium 2.7 (LL) 3.5 - 5.1 mmol/L    Comment: CRITICAL RESULT CALLED TO, READ BACK BY AND VERIFIED WITH: HARRY,A @2342  BY MATTHEWS, B 7.20.22    Chloride 102 98 - 111 mmol/L   CO2 26 22 - 32 mmol/L   Glucose, Bld 106 (H) 70 - 99 mg/dL    Comment: Glucose reference range applies only to samples taken after fasting for at least 8 hours.   BUN 8 6 - 20 mg/dL   Creatinine, Ser 05-13-2005 0.44 - 1.00 mg/dL   Calcium 8.9 8.9 - 9.62 mg/dL   GFR, Estimated 95.2 >84 mL/min    Comment: (NOTE) Calculated using the CKD-EPI Creatinine Equation (2021)    Anion gap 10 5 - 15    Comment: Performed at [facility], [address], [place], [place]  CBC     Status: None   Collection Time: 04/28/21 10:15 PM  Result Value Ref Range   WBC 7.3 4.0 - 10.5 K/uL   RBC 5.08 3.87 - 5.11 MIL/uL   Hemoglobin 13.9 12.0 - 15.0 g/dL   HCT 04/30/21 02.7 - 25.3 %   MCV 84.6 80.0 - 100.0 fL   MCH 27.4 26.0 - 34.0 pg   MCHC 32.3 30.0 - 36.0 g/dL   RDW 66.4 40.3 - 47.4 %   Platelets 254 150 - 400 K/uL   nRBC 0.0 0.0 - 0.2 %    Comment: Performed at [facility], [address] [place], [place]  Troponin I (High Sensitivity)     Status: None   Collection Time: 04/28/21 10:15 PM  Result Value Ref Range   Troponin I (High Sensitivity) 5 <18 ng/L    Comment: (NOTE) Elevated high sensitivity troponin I (hsTnI) values and significant  changes across serial measurements may suggest ACS but many other  chronic and acute conditions are known to elevate hsTnI results.  Refer to the Links section for chest pain algorithms  and additional  guidance. Performed at [facility], [address] [place], [place]   D-dimer, quantitative     Status: Abnormal   Collection Time: 04/28/21 10:15 PM  Result Value Ref Range   D-Dimer, Quant 0.52 (H) 0.00 - 0.50 ug/mL-FEU    Comment: (NOTE) At the manufacturer cut-off value of 0.5 g/mL FEU, this assay has a negative predictive value of 95-100%.This assay is intended for use in conjunction with a clinical pretest probability (PTP) assessment model to exclude pulmonary embolism (PE) and deep venous thrombosis (DVT) in outpatients suspected of PE or DVT. Results should be correlated with clinical presentation. Performed at [facility], [address] [place], [place]   Hepatic function panel     Status: None   Collection Time: 04/28/21 10:15 PM  Result Value Ref Range   Total Protein 7.3 6.5 - 8.1 g/dL   Albumin 3.7 3.5 - 5.0 g/dL   AST 25 15 - 41 U/L   ALT 20 0 - 44 U/L   Alkaline Phosphatase 55 38 - 126 U/L   Total Bilirubin 0.8 0.3 - 1.2 mg/dL   Bilirubin, Direct 0.1 0.0 - 0.2 mg/dL   Indirect Bilirubin 0.7 0.3 - 0.9 mg/dL    Comment: Performed at [facility], [address], [place], [place]  Troponin I (High Sensitivity)     Status: None   Collection Time:  04/29/21 12:27 AM  Result Value Ref Range   Troponin I (High Sensitivity) 4 <18 ng/L    Comment: (NOTE) Elevated high sensitivity troponin I (hsTnI) values and significant  changes across serial measurements may suggest ACS but many other  chronic and acute conditions are known to elevate hsTnI results.  Refer to the "Links" section for chest pain algorithms and additional  guidance. Performed at [facility], [address] [place], [place] [address]   Resp Panel by RT-PCR (Flu A&B, Covid) Nasopharyngeal Swab     Status: None   Collection Time: 04/29/21  3:26 AM   Specimen: Nasopharyngeal Swab; Nasopharyngeal(NP) swabs in vial transport medium  Result Value Ref Range    SARS Coronavirus 2 by RT PCR NEGATIVE NEGATIVE    Comment: (NOTE) SARS-CoV-2 target nucleic acids are NOT DETECTED.  The SARS-CoV-2 RNA is generally detectable in upper respiratory specimens during the acute phase of infection. The lowest concentration of SARS-CoV-2 viral copies this assay can detect is 138 copies/mL. A negative result does not preclude SARS-Cov-2 infection and should not be used as the sole basis for treatment or other patient management decisions. A negative result may occur with  improper specimen collection/handling, submission of specimen other than nasopharyngeal swab, presence of viral mutation(s) within the areas targeted by this assay, and inadequate number of viral copies(<138 copies/mL). A negative result must be combined with clinical observations, patient history, and epidemiological information. The expected result is Negative.  Fact Sheet for Patients:  BloggerCourse.com  Fact Sheet for Healthcare Providers:  SeriousBroker.it  This test is no t yet approved or cleared by the [place] FDA and  has been authorized for detection and/or diagnosis of SARS-CoV-2 by FDA under an Emergency Use Authorization (EUA). This EUA will remain  in effect (meaning this test can be used) for the duration of the COVID-19 declaration under Section 564(b)(1) of the Act, 21 U.S.C.section 360bbb-3(b)(1), unless the authorization is terminated  or revoked sooner.       Influenza A by PCR NEGATIVE NEGATIVE   Influenza B by PCR NEGATIVE NEGATIVE    Comment: (NOTE) The Xpert Xpress SARS-CoV-2/FLU/RSV plus assay is intended as an aid in the diagnosis of influenza from Nasopharyngeal swab specimens and should not be used as a sole basis for treatment. Nasal washings and aspirates are unacceptable for Xpert Xpress SARS-CoV-2/FLU/RSV testing.  Fact Sheet for Patients: BloggerCourse.com  Fact  Sheet for Healthcare Providers: SeriousBroker.it  This test is not yet approved or cleared by the [place] FDA and has been authorized for detection and/or diagnosis of SARS-CoV-2 by FDA under an Emergency Use Authorization (EUA). This EUA will remain in effect (meaning this test can be used) for the duration of the COVID-19 declaration under Section 564(b)(1) of the Act, 21 U.S.C. section 360bbb-3(b)(1), unless the authorization is terminated or revoked.  Performed at [facility], [address] [place], [place] [address]     Chemistries  Recent Labs  Lab 04/28/21 2215  NA 138  K 2.7*  CL 102  CO2 26  GLUCOSE 106*  BUN 8  CREATININE 0.70  CALCIUM 8.9  AST 25  ALT 20  ALKPHOS 55  BILITOT 0.8   ------------------------------------------------------------------------------------------------------------------  ------------------------------------------------------------------------------------------------------------------ GFR: Estimated Creatinine Clearance: 107.4 mL/min (by C-G formula based on SCr of 0.7 mg/dL). Liver Function Tests: Recent Labs  Lab 04/28/21 2215  AST 25  ALT 20  ALKPHOS 55  BILITOT 0.8  PROT 7.3  ALBUMIN 3.7   No results for input(s):  LIPASE, AMYLASE in the last 168 hours. No results for input(s): AMMONIA in the last 168 hours. Coagulation Profile: No results for input(s): INR, PROTIME in the last 168 hours. Cardiac Enzymes: No results for input(s): CKTOTAL, CKMB, CKMBINDEX, TROPONINI in the last 168 hours. BNP (last 3 results) No results for input(s): PROBNP in the last 8760 hours. HbA1C: No results for input(s): HGBA1C in the last 72 hours. CBG: No results for input(s): GLUCAP in the last 168 hours. Lipid Profile: No results for input(s): CHOL, HDL, LDLCALC, TRIG, CHOLHDL, LDLDIRECT in the last 72 hours. Thyroid Function Tests: No results for input(s): TSH, T4TOTAL, FREET4, T3FREE, THYROIDAB in the last  72 hours. Anemia Panel: No results for input(s): VITAMINB12, FOLATE, FERRITIN, TIBC, IRON, RETICCTPCT in the last 72 hours.  --------------------------------------------------------------------------------------------------------------- Urine analysis:    Component Value Date/Time   COLORURINE YELLOW 11/19/2007 0931   APPEARANCEUR CLOUDY (A) 11/19/2007 0931   LABSPEC 1.010 11/19/2007 0931   PHURINE 7.0 11/19/2007 0931   GLUCOSEU NEGATIVE 11/19/2007 0931   HGBUR SMALL (A) 11/19/2007 0931   BILIRUBINUR NEGATIVE 11/19/2007 0931   KETONESUR NEGATIVE 11/19/2007 0931   PROTEINUR NEGATIVE 11/19/2007 0931   UROBILINOGEN 0.2 11/19/2007 0931   NITRITE POSITIVE (A) 11/19/2007 0931   LEUKOCYTESUR TRACE (A) 11/19/2007 0931      Imaging Results:    CT Angio Chest PE W and/or Wo Contrast  Result Date: 04/29/2021 CLINICAL DATA:  Intermittent chest pain, vomiting, elevated D-dimer EXAM: CT ANGIOGRAPHY CHEST WITH CONTRAST TECHNIQUE: Multidetector CT imaging of the chest was performed using the standard protocol during bolus administration of intravenous contrast. Multiplanar CT image reconstructions and MIPs were obtained to evaluate the vascular anatomy. CONTRAST:  OMNIPAQUE IOHEXOL 350 MG/ML SOLN COMPARISON:  None. FINDINGS: Cardiovascular: Satisfactory opacification of the pulmonary arteries to the segmental level. No evidence of pulmonary embolism. Normal heart size. No pericardial effusion. Mediastinum/Nodes: The visualized thyroid is unremarkable. No pathologic thoracic adenopathy. The esophagus is unremarkable. Small hiatal hernia. Lungs/Pleura: Lungs are clear. No pleural effusion or pneumothorax. Upper Abdomen: No acute abnormality. Musculoskeletal: No chest wall abnormality. No acute or significant osseous findings. Review of the MIP images confirms the above findings. IMPRESSION: No pulmonary embolism.  No acute intrathoracic pathology identified. Small hiatal hernia. Electronically Signed    By: Helyn Numbers MD   On: 04/29/2021 00:23   DG Chest Port 1 View  Result Date: 04/28/2021 CLINICAL DATA:  Chest pain. Intermittent chest pain for 2 months, worse today. EXAM: PORTABLE CHEST 1 VIEW COMPARISON:  Radiograph 09/15/2014 FINDINGS: Upper normal heart size, likely accentuated by portable technique.The cardiomediastinal contours are normal. The lungs are clear. Pulmonary vasculature is normal. No consolidation, pleural effusion, or pneumothorax. Remote right second rib fracture. No acute osseous abnormalities are seen. IMPRESSION: Upper normal heart size, likely accentuated by portable technique. No acute chest findings. Electronically Signed   By: Narda Rutherford M.D.   On: 04/28/2021 22:45    My personal review of EKG: Rhythm sinus bradycardia, Rate 52/min, QTc 444 ,no Acute ST changes   Assessment & Plan:    Active Problems:   Chest pain   Chest pain Continue aspirin Holding beta-blockers given bradycardia Troponin 5 and 4 Elevated heart score EKG without ischemic changes Admit to telemetry Cardiology consult in a.m. Hypokalemia Potassium 2.7 Secondary to poor p.o. intake Replace and recheck Add a magnesium but blood work Monitor on telemetry Tobacco use disorder Advised on the importance of cessation Patient reports she is ready to quit Chronic pain  Continue Subutex Psychiatric disorder Continue Xanax, Abilify, Seroquel    DVT Prophylaxis-   Heparin- SCDs   AM Labs Ordered, also please review Full Orders  Family Communication: No family at bedside Code Status: Full  Admission status: Observation Toben Acuna B Zierle-Ghosh DO

## 2021-04-29 NOTE — Progress Notes (Signed)
Denied chest pain today.  Echo complete and to follow up with cardiology outpatient.  IV removed and script sent to pharmacy.  Transported by WC to entrance and family member drove home.

## 2021-04-29 NOTE — Progress Notes (Signed)
  Echocardiogram 2D Echocardiogram has been performed.  [name] 04/29/2021, 9:08 AM

## 2021-05-02 NOTE — Progress Notes (Signed)
Cardiology Office Note  Date: 05/03/2021   ID: Caitlyn Irwin, DOB September 03, 1976, MRN [id number]  PCP:  Pcp, No  Cardiologist:  None Electrophysiologist:  None   Chief Complaint: Hospital follow-up  History of Present Illness: Caitlyn Irwin is a 45 y.o. female with a history of COPD, GERD, seizures,, severe anxiety, anemia, anxiety, bipolar disorder, hyperlipidemia, chest pain, current smoker, obesity.  She recently presented to [facility] with chief complaint of chest pain.  Described as intermittent for the prior 2 months. She had accompanying nausea, diaphoresis, dyspnea, and palpitations. Pain relieved at rest and worse with exertion. She was admitted for observation on 04/28/2021.  She had CT of the chest which showed no acute findings, small hiatal hernia.  She had inpatient cardiology consultation.  Her troponins were negative.  She had an inpatient 2D echocardiogram which showed no significant significant findings.  See report below.  Her LDL and total cholesterol were elevated and she was started on atorvastatin 40 mg daily.  Decision was made for outpatient follow-up for possible ischemic work-up.  She was counseled on tobacco cessation and given nicotine patch to use in the hospital.  She is here for hospital follow-up.  She continues to complain of shortness of breath and chest pain along with some diaphoresis when performing more than usual ADLs.  Long history of smoking for 30+ years smoking without any evaluation by pulmonary.  She admits to significant snoring, periods of apnea/hypoapnea and excessive daytime sleepiness.  She also admits to some rapid heart rates and palpitations when performing more than usual activities.  She denies any CVA or TIA-like symptoms, orthostatic symptoms, PND, orthopnea, bleeding.  Denies any claudication, DVT or PE-like symptoms, or lower extremity edema.  She continues to smoke.  Blood pressure is elevated today, however she states she smoked a  cigarette prior to arriving to clinic.    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     ARIPiprazole (ABILIFY) 10 MG tablet Take 10 mg by mouth daily.     aspirin 81 MG chewable tablet Chew 1 tablet (81 mg total) by mouth daily.     atorvastatin (LIPITOR) 40 MG tablet Take 1 tablet (40 mg total) by mouth every evening. 30 tablet 1   buprenorphine (SUBUTEX) 8 MG SUBL SL tablet Place 8 mg under the tongue 3 (three) times daily.     prazosin (MINIPRESS) 2 MG capsule Take 2 mg by mouth at bedtime.     QUEtiapine (SEROQUEL) 50 MG tablet Take 50-100 mg by mouth at bedtime.     No current facility-administered medications for this visit.   Allergies:  Patient has no known allergies.   Social History: The patient  reports that she has been smoking cigarettes. She started smoking about 32 years ago. She has a 30.00 pack-year smoking history. She has never used smokeless tobacco. She reports previous drug use. Drugs: Cocaine,  Marijuana, Heroin, and Methamphetamines. She reports that she does not drink alcohol.   Family History: The patient's family history includes Anxiety disorder in her mother; Diabetes in her mother; Heart attack in her mother; Heart disease in her mother; Hypertension in her father.   ROS:  Please see the history of present illness. Otherwise, complete review of systems is positive for none.  All other systems are reviewed and negative.   Physical Exam: VS:  BP 140/88   Pulse 79   Ht  (1.702 m)    Wt 222 lb 3.2 oz (100.8 kg)   LMP 04/21/2021   SpO2 99%   BMI 34.80 kg/m , BMI Body mass index is 34.8 kg/m.  Wt Readings from Last 3 Encounters:  05/03/21 222 lb 3.2 oz (100.8 kg)  04/28/21 223 lb (101.2 kg)  11/20/18 226 lb (102.5 kg)    General: Patient appears comfortable at rest. Neck: Supple, no elevated JVP or carotid bruits, no thyromegaly. Lungs: Clear to auscultation, nonlabored breathing at rest. Cardiac: Regular rate and rhythm, no S3 or significant systolic murmur, no pericardial rub. Extremities: No pitting edema, distal pulses 2+. Skin: Warm and dry. Musculoskeletal: No kyphosis. Neuropsychiatric: Alert and oriented x3, affect grossly appropriate.  ECG:  EKG 04/28/2021 sinus rhythm rate of 65   Recent Labwork: 04/28/2021: Hemoglobin 13.9; Platelets 254 04/29/2021: ALT 20; AST 21; BUN 8; Creatinine, Ser 0.70; Magnesium 2.1; Potassium 3.8; Sodium 137; TSH 1.091     Component Value Date/Time   CHOL 236 (H) 04/29/2021 0855   TRIG 103 04/29/2021 0855   HDL 33 (L) 04/29/2021 0855   CHOLHDL 7.2 04/29/2021 0855   VLDL 21 04/29/2021 0855   LDLCALC 182 (H) 04/29/2021 0855    Other Studies Reviewed Today:  Procedures/Studies: CT Angio Chest PE W and/or Wo Contrast   Result Date: 04/29/2021 CLINICAL DATA:  Intermittent chest pain, vomiting, elevated D-dimer EXAM: CT ANGIOGRAPHY CHEST WITH CONTRAST TECHNIQUE: Multidetector CT imaging of the chest was performed using the standard protocol during bolus administration of intravenous contrast. Multiplanar CT image reconstructions and MIPs were obtained to evaluate the vascular anatomy. CONTRAST:  OMNIPAQUE IOHEXOL 350 MG/ML SOLN COMPARISON:  None. FINDINGS: Cardiovascular: Satisfactory opacification of the pulmonary arteries to the segmental level. No evidence of pulmonary embolism. Normal heart size. No pericardial effusion. Mediastinum/Nodes: The visualized thyroid is unremarkable. No pathologic thoracic adenopathy. The  esophagus is unremarkable. Small hiatal hernia. Lungs/Pleura: Lungs are clear. No pleural effusion or pneumothorax. Upper Abdomen: No acute abnormality. Musculoskeletal: No chest wall abnormality. No acute or significant osseous findings. Review of the MIP images confirms the above findings. IMPRESSION: No pulmonary embolism.  No acute intrathoracic pathology identified. Small hiatal hernia. Electronically Signed   By: Helyn Numbers MD   On: 04/29/2021 00:23   DG Chest Port 1 View   Result Date: 04/28/2021 CLINICAL DATA:  Chest pain. Intermittent chest pain for 2 months, worse today. EXAM: PORTABLE CHEST 1 VIEW COMPARISON:  Radiograph 09/15/2014 FINDINGS: Upper normal heart size, likely accentuated by portable technique.The cardiomediastinal contours are normal. The lungs are clear. Pulmonary vasculature is normal. No consolidation, pleural effusion, or pneumothorax. Remote right second rib fracture. No acute osseous abnormalities are seen. IMPRESSION: Upper normal heart size, likely accentuated by portable technique. No acute chest findings. Electronically Signed   By: Narda Rutherford M.D.   On: 04/28/2021 22:45   ECHOCARDIOGRAM COMPLETE   Result Date: 04/29/2021    ECHOCARDIOGRAM REPORT   Patient Name:   Caitlyn Irwin  Goodhart Date of Exam: 04/29/2021 Medical Rec #:  [id number]         Height:       67.0 in Accession #:    [id number]        Weight:       223.0 lb Date of Birth:  11-25-1975         BSA:          2.118 m Patient Age:    45 years          BP:           105/69 mmHg Patient Gender: F                 HR:           45 bpm. [facility] Procedure: 2D Echo, Cardiac Doppler, Color Doppler and Intracardiac            Opacification Agent Indications:    Chest pain  History:        Patient has no prior history of Echocardiogram examinations.                 COPD, Signs/Symptoms:Chest Pain; Risk Factors:Current Smoker and                 Obesity.  Sonographer:    Lavenia Atlas RDCS  Referring Phys: [id number] Ellsworth Lennox  Sonographer Comments: Patient is morbidly obese and Technically difficult study due to poor echo windows. IMPRESSIONS  1. Left ventricular ejection fraction, by estimation, is 60 to 65%. The left ventricle has normal function. The left ventricle has no regional wall motion abnormalities. There is mild left ventricular hypertrophy. Left ventricular diastolic parameters were normal.  2. Right ventricular systolic function is normal. The right ventricular size is normal. There is normal pulmonary artery systolic pressure. The estimated right ventricular systolic pressure is 23.2 mmHg.  3. The mitral valve is grossly normal. Trivial mitral valve regurgitation.  4. The aortic valve is tricuspid. Aortic valve regurgitation is not visualized.  5. The inferior vena cava is normal in size with greater than 50% respiratory variability, suggesting right atrial pressure of 3 mmHg. FINDINGS  Left Ventricle: Left ventricular ejection fraction, by estimation, is 60 to 65%. The left ventricle has normal function. The left ventricle has no regional wall motion abnormalities. Definity contrast agent was given IV to delineate the left ventricular  endocardial borders. The left ventricular internal cavity size was normal in size. There is mild left ventricular hypertrophy. Left ventricular diastolic parameters were normal. Right Ventricle: The right ventricular size is normal. No increase in right ventricular wall thickness. Right ventricular systolic function is normal. There is normal pulmonary artery systolic pressure. The tricuspid regurgitant velocity is 2.25 m/s, and  with an assumed right atrial pressure of 3 mmHg, the estimated right ventricular systolic pressure is 23.2 mmHg. Left Atrium: Left atrial size was normal in size. Right Atrium: Right atrial size was normal in size. Pericardium: There is no evidence of pericardial effusion. Mitral Valve: The mitral valve is grossly normal.  Trivial mitral valve regurgitation. Tricuspid Valve: The tricuspid valve is grossly normal. Tricuspid valve regurgitation is trivial. Aortic Valve: The aortic valve is tricuspid. There is mild aortic valve annular calcification. Aortic valve regurgitation is not visualized. Pulmonic Valve: The pulmonic valve was grossly normal. Pulmonic valve regurgitation is not visualized. Aorta: The aortic root is normal in size and structure. Venous: The inferior vena cava is normal in size with  greater than 50% respiratory variability, suggesting right atrial pressure of 3 mmHg. IAS/Shunts: No atrial level shunt detected by color flow Doppler.  LEFT VENTRICLE PLAX 2D LVIDd:         4.64 cm  Diastology LVIDs:         3.09 cm  LV e' medial:    11.30 cm/s LV PW:         1.11 cm  LV E/e' medial:  8.1 LV IVS:        1.08 cm  LV e' lateral:   13.80 cm/s LVOT diam:     2.10 cm  LV E/e' lateral: 6.6 LV SV:         107 LV SV Index:   50 LVOT Area:     3.46 cm  RIGHT VENTRICLE RV Basal diam:  2.55 cm RV S prime:     8.15 cm/s LEFT ATRIUM             Index       RIGHT ATRIUM           Index LA diam:        3.50 cm 1.65 cm/m  RA Area:     13.30 cm LA Vol (A2C):   44.5 ml 21.01 ml/m RA Volume:   31.60 ml  14.92 ml/m LA Vol (A4C):   50.0 ml 23.61 ml/m LA Biplane Vol: 47.1 ml 22.24 ml/m  AORTIC VALVE LVOT Vmax:   115.00 cm/s LVOT Vmean:  75.500 cm/s LVOT VTI:    0.308 m  AORTA Ao Root diam: 2.90 cm MITRAL VALVE               TRICUSPID VALVE MV Area (PHT): 2.46 cm    TR Peak grad:   20.2 mmHg MV Decel Time: 309 msec    TR Vmax:        225.00 cm/s MV E velocity: 91.00 cm/s MV A velocity: 47.90 cm/s  SHUNTS MV E/A ratio:  1.90        Systemic VTI:  0.31 m                            Systemic Diam: 2.10 cm Nona Dell MD Electronically signed by Nona Dell MD Signature Date/Time: 04/29/2021/11:34:29 AM    Final      Assessment and Plan:  1. Chest pain, unspecified type   2. Hyperlipidemia, unspecified hyperlipidemia type   3.  Smoker   4. Chronic obstructive pulmonary disease, unspecified COPD type (HCC)   5. Sleep apnea, unspecified type   6. Elevated blood pressure reading without diagnosis of hypertension    1. Chest pain, unspecified type Continues with ongoing chest pain.  Her daughter-in-law who is with her states she has been having chest pain for over 2 years.  Recent visit to [facility], ED with complaints of chest pain ongoing for 2 months.  She was ruled out for ACS.  Echocardiogram and chest CT showed no significant findings.  She did have a small hiatal hernia on CT scan.  Please schedule Lexiscan stress test.  2. Hyperlipidemia, unspecified hyperlipidemia type Lipid panel 04/29/2021: TC 236, HDL 33, LDL 182, TG 103.  Continue atorvastatin 40 mg daily.  Continue aspirin 81 mg daily.  Get FLP and LFTs in 8 weeks  3. Smoker 30+ year history of smoking.  Currently smoking.  Advised cessation.  He states she will try to quit.  4.  Suspected sleep apnea  Having symptoms of snoring, apnea/hypoapnea, excessive daytime sleepiness.  Please refer to Dr. Vassie LollAlva or Dr. Sherene SiresWert for evaluation for sleep apnea.  5.  Shortness of breath, history of smoking, COPD Complains of shortness of breath with more than normal ADL activity.  Please refer to Dr. Vassie LollAlva or Dr. Work for evaluation of COPD.  She likely needs PFTs.  6.  Elevated blood pressure without diagnosis of hypertension. Blood pressure elevated today at 140/88.  She has no formal diagnosis of hypertension.  She did states she smoked a cigarette prior to arriving at the clinic.  Advised to check blood pressures with goal of 130/80 or less.   Medication Adjustments/Labs and Tests Ordered: Current medicines are reviewed at length with the patient today.  Concerns regarding medicines are outlined above.   Disposition: Follow-up with Dr. Wyline MoodBranch or APP 6 months  Signed, Rennis HardingAndrew Quinn, NP 05/03/2021 9:43 AM    [facility] [address]  [place], [place], [place] [address] Phone: [phone](336) [phone]; Fax: [phone]) [phone]

## 2021-05-03 ENCOUNTER — Encounter: Payer: Self-pay | Admitting: Family Medicine

## 2021-05-03 ENCOUNTER — Ambulatory Visit (INDEPENDENT_AMBULATORY_CARE_PROVIDER_SITE_OTHER): Payer: 59 | Admitting: Family Medicine

## 2021-05-03 ENCOUNTER — Telehealth: Payer: Self-pay | Admitting: Family Medicine

## 2021-05-03 ENCOUNTER — Encounter: Payer: Self-pay | Admitting: *Deleted

## 2021-05-03 ENCOUNTER — Other Ambulatory Visit: Payer: Self-pay

## 2021-05-03 VITALS — BP 140/88 | HR 79 | Ht 67.0 in | Wt 222.2 lb

## 2021-05-03 DIAGNOSIS — R079 Chest pain, unspecified: Secondary | ICD-10-CM | POA: Diagnosis not present

## 2021-05-03 DIAGNOSIS — F17200 Nicotine dependence, unspecified, uncomplicated: Secondary | ICD-10-CM | POA: Diagnosis not present

## 2021-05-03 DIAGNOSIS — J449 Chronic obstructive pulmonary disease, unspecified: Secondary | ICD-10-CM | POA: Diagnosis not present

## 2021-05-03 DIAGNOSIS — R030 Elevated blood-pressure reading, without diagnosis of hypertension: Secondary | ICD-10-CM

## 2021-05-03 DIAGNOSIS — E785 Hyperlipidemia, unspecified: Secondary | ICD-10-CM

## 2021-05-03 DIAGNOSIS — G4730 Sleep apnea, unspecified: Secondary | ICD-10-CM

## 2021-05-03 NOTE — Telephone Encounter (Signed)
Checking percert on the following patient for testing scheduled at [facility].    LEXISCAN scheduled for 817/2022

## 2021-05-03 NOTE — Patient Instructions (Signed)
Medication Instructions:  Continue all current medications.  Labwork: FLP, LFT - due in 8 weeks  Will mail reminder when time.   Testing/Procedures: Your physician has requested that you have a lexiscan myoview. For further information please visit [email or web address] Please follow instruction sheet, as given. Office will contact with results via phone or letter.    Follow-Up: 6 months   Any Other Special Instructions Will Be Listed Below (If Applicable). You have been referred to:  pulmonology   If you need a refill on your cardiac medications before your next appointment, please call your pharmacy.

## 2021-05-11 ENCOUNTER — Ambulatory Visit: Payer: Self-pay | Admitting: Family Medicine

## 2021-05-26 ENCOUNTER — Other Ambulatory Visit: Payer: Self-pay

## 2021-05-26 ENCOUNTER — Encounter (HOSPITAL_COMMUNITY): Payer: Self-pay

## 2021-05-26 ENCOUNTER — Ambulatory Visit (HOSPITAL_COMMUNITY)
Admission: RE | Admit: 2021-05-26 | Discharge: 2021-05-26 | Disposition: A | Discharge status: Home / Self Care | Payer: 59 | Source: Ambulatory Visit | Attending: Family Medicine | Admitting: Family Medicine

## 2021-05-26 ENCOUNTER — Encounter (HOSPITAL_COMMUNITY)
Admission: RE | Admit: 2021-05-26 | Discharge: 2021-05-26 | Disposition: A | Discharge status: Home / Self Care | Payer: 59 | Source: Ambulatory Visit | Attending: Family Medicine | Admitting: Family Medicine

## 2021-05-26 DIAGNOSIS — R079 Chest pain, unspecified: Secondary | ICD-10-CM | POA: Insufficient documentation

## 2021-05-26 LAB — NM MYOCAR MULTI W/SPECT W/WALL MOTION / EF
LV dias vol: 70 mL (ref 46–106)
LV sys vol: 30 mL
Peak HR: 94 {beats}/min
RATE: 0.8
Rest HR: 59 {beats}/min
SDS: 1
SRS: 2
SSS: 3
TID: 1.22

## 2021-05-26 MED ORDER — TECHNETIUM TC 99M TETROFOSMIN IV KIT
10.0000 | PACK | Freq: Once | INTRAVENOUS | Status: AC | PRN
  Administered 2021-05-26: 10.7 via INTRAVENOUS

## 2021-05-26 MED ORDER — SODIUM CHLORIDE FLUSH 0.9 % IV SOLN
INTRAVENOUS | Status: AC
  Administered 2021-05-26: 10 mL via INTRAVENOUS
  Filled 2021-05-26: qty 10

## 2021-05-26 MED ORDER — REGADENOSON 0.4 MG/5ML IV SOLN
INTRAVENOUS | Status: AC
  Administered 2021-05-26: 0.4 mg via INTRAVENOUS
  Filled 2021-05-26: qty 5

## 2021-05-26 MED ORDER — TECHNETIUM TC 99M TETROFOSMIN IV KIT
30.0000 | PACK | Freq: Once | INTRAVENOUS | Status: AC | PRN
  Administered 2021-05-26: 33 via INTRAVENOUS

## 2021-05-28 ENCOUNTER — Telehealth: Payer: Self-pay | Admitting: *Deleted

## 2021-05-28 NOTE — Telephone Encounter (Signed)
-----   Message from Netta Neat., NP sent at 05/26/2021  7:35 PM EDT ----- Please call the patient and let her know the stress test did not show any evidence of lack of blood flow to the heart. It was considered a low risk study by the interpreting physician  Netta Neat, NP  05/26/2021 7:35 PM

## 2021-05-28 NOTE — Telephone Encounter (Signed)
Lesle Chris, LPN  02/17/2584  3:05 PM EDT Back to Top    Notified, no pcp.

## 2021-06-25 ENCOUNTER — Ambulatory Visit (INDEPENDENT_AMBULATORY_CARE_PROVIDER_SITE_OTHER): Payer: 59 | Admitting: Pulmonary Disease

## 2021-06-25 ENCOUNTER — Encounter: Payer: Self-pay | Admitting: Pulmonary Disease

## 2021-06-25 ENCOUNTER — Other Ambulatory Visit: Payer: Self-pay

## 2021-06-25 VITALS — BP 110/78 | HR 56 | Temp 98.8°F | Ht 67.0 in | Wt 213.0 lb

## 2021-06-25 DIAGNOSIS — Z720 Tobacco use: Secondary | ICD-10-CM

## 2021-06-25 DIAGNOSIS — R0683 Snoring: Secondary | ICD-10-CM

## 2021-06-25 DIAGNOSIS — R053 Chronic cough: Secondary | ICD-10-CM | POA: Diagnosis not present

## 2021-06-25 MED ORDER — ALBUTEROL SULFATE HFA 108 (90 BASE) MCG/ACT IN AERS: 2.0000 | INHALATION_SPRAY | Freq: Four times a day (QID) | RESPIRATORY_TRACT | 2 refills | Status: DC | PRN

## 2021-06-25 NOTE — Progress Notes (Signed)
Caitlyn Irwin, Critical Care, and Sleep Medicine  Chief Complaint  Patient presents with   Consult    Sleep Consult. Snoring at night. Periods of apnea when sleeping per daughter in law. Feels tired after sleeping. Feels some SOB.     Constitutional:  BP 110/78 (BP Location: Left Arm, Patient Position: Sitting)   Pulse (!) 56   Temp 98.8 F (37.1 C) (Temporal)   Ht 5\' 7"  (1.702 m)   Wt 213 lb (96.6 kg)   SpO2 97%   BMI 33.36 kg/m   Past Medical History:  Anemia, Anxiety, Bipolar, Depression, GERD, Seizures from medications  Past Surgical History:  She  has a past surgical history that includes Tubal ligation (1993).  Brief Summary:  Caitlyn Irwin is a 45 y.o. female smoker with cough, dyspnea, and snoring.      Subjective:   She is here with her daughter.  She was seen by cardiology for atypical chest pain, and advised she needs further Irwin assessment.    She smokes 1 ppd and has done so for about 30 years.  She wants to quit.   She was told in the ER that she has emphysema and COPD, but not clear how this was determined.  No history of asthma.  She has not been tried on inhaler therapy before.  She gets cough with clear sputum.  Gets winded walking sometimes.  Has intermittent wheeze.  She snores and will stop breathing at night.  She sometimes talks in her sleep.  She will feel tired during the day.  She goes to sleep at 9 pm.  She falls asleep 30 minutes.  She wakes up 1 or 2 times to use the bathroom.  She gets out of bed at 8 am.  She feels tired in the morning.  She denies morning headache.  She does not use anything to help her stay awake.  She takes seroquel at night.9  She denies sleep walking, sleep talking, bruxism, or nightmares.  There is no history of restless legs.  She denies sleep hallucinations, sleep paralysis, or cataplexy.  The Epworth score is 9 out of 24.   Physical Exam:   Appearance - well kempt   ENMT - no sinus  tenderness, no oral exudate, no LAN, Mallampati 4 airway, no stridor  Respiratory - equal breath sounds bilaterally, no wheezing or rales  CV - s1s2 regular rate and rhythm, no murmurs  Ext - no clubbing, no edema  Skin - no rashes  Psych - normal mood and affect   Irwin testing:    Chest Imaging:  CT angio chest 04/2121 >> small hiatal hernia  Sleep Tests:    Cardiac Tests:  Echo 04/29/21 >> EF 60 to 65%, mild LVH  Social History:  She  reports that she has been smoking cigarettes. She started smoking about 32 years ago. She has a 30.00 pack-year smoking history. She has never used smokeless tobacco. She reports that she does not currently use drugs after having used the following drugs: Cocaine, Marijuana, Heroin, and Methamphetamines. She reports that she does not drink alcohol.  Family History:  Her family history includes Anxiety disorder in her mother; Diabetes in her mother; Heart attack in her mother; Heart disease in her mother; Hypertension in her father.    Discussion:  She has cough, wheeze, and dyspnea.  She has extensive history of smoking.  She could have obstructive lung disease.  She has snoring, sleep disruption, apnea, and daytime sleepiness.  She has history of mood disorders.  I am concerned she could have obstructive sleep apnea.  Assessment/Plan:   Chronic cough, dyspnea, wheezing. - will arrange for Irwin function test to assess for COPD - prn albuterol  Tobacco abuse. - she will check with her psychiatrist to determine if she could use chantix or bupropion to help with smoking cessation - she will try nicotine patch  Snoring with excessive daytime sleepiness. - will need to arrange for a home sleep study  Obesity. - discussed how weight can impact sleep and risk for sleep disordered breathing - discussed options to assist with weight loss: combination of diet modification, cardiovascular and strength training  exercises  Cardiovascular risk. - had an extensive discussion regarding the adverse health consequences related to untreated sleep disordered breathing - specifically discussed the risks for hypertension, coronary artery disease, cardiac dysrhythmias, cerebrovascular disease, and diabetes - lifestyle modification discussed  Safe driving practices. - discussed how sleep disruption can increase risk of accidents, particularly when driving - safe driving practices were discussed  Therapies for obstructive sleep apnea. - if the sleep study shows significant sleep apnea, then various therapies for treatment were reviewed: CPAP, oral appliance, and surgical interventions   Time Spent Involved in Patient Care on Day of Examination:  47 minutes  Follow up:   Patient Instructions  Albuterol two puffs every 4 to 6 hours as needed for cough, wheeze, chest congestion or shortness of breath.  Will arrange for Irwin function test and home sleep study.  Talk to Dr. Karleen Hampshire about whether you could try chantix or bupropion to help with smoking cessation.  You can try nicotine patch to help with this.  Follow up in 6 to 8 weeks.  Medication List:   Allergies as of 06/25/2021   No Known Allergies      Medication List        Accurate as of June 25, 2021 12:21 PM. If you have any questions, ask your nurse or doctor.          STOP taking these medications    ALPRAZolam 1 MG tablet Commonly known as: Prudy Feeler Stopped by: Coralyn Helling, MD   atorvastatin 40 MG tablet Commonly known as: LIPITOR Stopped by: Coralyn Helling, MD       TAKE these medications    albuterol 108 (90 Base) MCG/ACT inhaler Commonly known as: VENTOLIN HFA Inhale 2 puffs into the lungs every 6 (six) hours as needed for wheezing or shortness of breath. Started by: Coralyn Helling, MD   ARIPiprazole 10 MG tablet Commonly known as: ABILIFY Take 10 mg by mouth daily.   aspirin 81 MG chewable tablet Chew 1  tablet (81 mg total) by mouth daily.   buprenorphine 8 MG Subl SL tablet Commonly known as: SUBUTEX Place 8 mg under the tongue 3 (three) times daily.   diazepam 5 MG tablet Commonly known as: VALIUM Take 5 mg by mouth every 6 (six) hours as needed for anxiety.   prazosin 2 MG capsule Commonly known as: MINIPRESS Take 2 mg by mouth at bedtime.   QUEtiapine 50 MG tablet Commonly known as: SEROQUEL Take 50-100 mg by mouth at bedtime.        Signature:  Coralyn Helling, MD [facility] Irwin/Critical Care Pager - [phone] 06/25/2021, 12:21 PM

## 2021-06-25 NOTE — Patient Instructions (Signed)
Albuterol two puffs every 4 to 6 hours as needed for cough, wheeze, chest congestion or shortness of breath.  Will arrange for pulmonary function test and home sleep study.  Talk to Dr. Karleen Hampshire about whether you could try chantix or bupropion to help with smoking cessation.  You can try nicotine patch to help with this.  Follow up in 6 to 8 weeks.

## 2021-07-01 ENCOUNTER — Other Ambulatory Visit: Payer: Self-pay | Admitting: *Deleted

## 2021-07-01 ENCOUNTER — Encounter: Payer: Self-pay | Admitting: *Deleted

## 2021-07-01 DIAGNOSIS — E785 Hyperlipidemia, unspecified: Secondary | ICD-10-CM

## 2021-08-25 ENCOUNTER — Ambulatory Visit: Payer: 59 | Admitting: Pulmonary Disease

## 2021-10-15 DIAGNOSIS — Z79899 Other long term (current) drug therapy: Secondary | ICD-10-CM | POA: Diagnosis not present

## 2021-11-12 DIAGNOSIS — Z79899 Other long term (current) drug therapy: Secondary | ICD-10-CM | POA: Diagnosis not present

## 2021-11-29 ENCOUNTER — Encounter: Payer: Self-pay | Admitting: Pulmonary Disease

## 2022-01-10 DIAGNOSIS — Z79899 Other long term (current) drug therapy: Secondary | ICD-10-CM | POA: Diagnosis not present

## 2022-03-04 DIAGNOSIS — Z79899 Other long term (current) drug therapy: Secondary | ICD-10-CM | POA: Diagnosis not present

## 2022-04-01 DIAGNOSIS — Z79899 Other long term (current) drug therapy: Secondary | ICD-10-CM | POA: Diagnosis not present

## 2022-04-29 IMAGING — DX DG CHEST 1V PORT
1 series · 1 of 1 positions shown · non-contrast
Comparison: Radiograph 09/15/2014

CLINICAL DATA: Chest pain. Intermittent chest pain for 2 months,
worse today.

EXAM:
PORTABLE CHEST 1 VIEW

[chest ap]
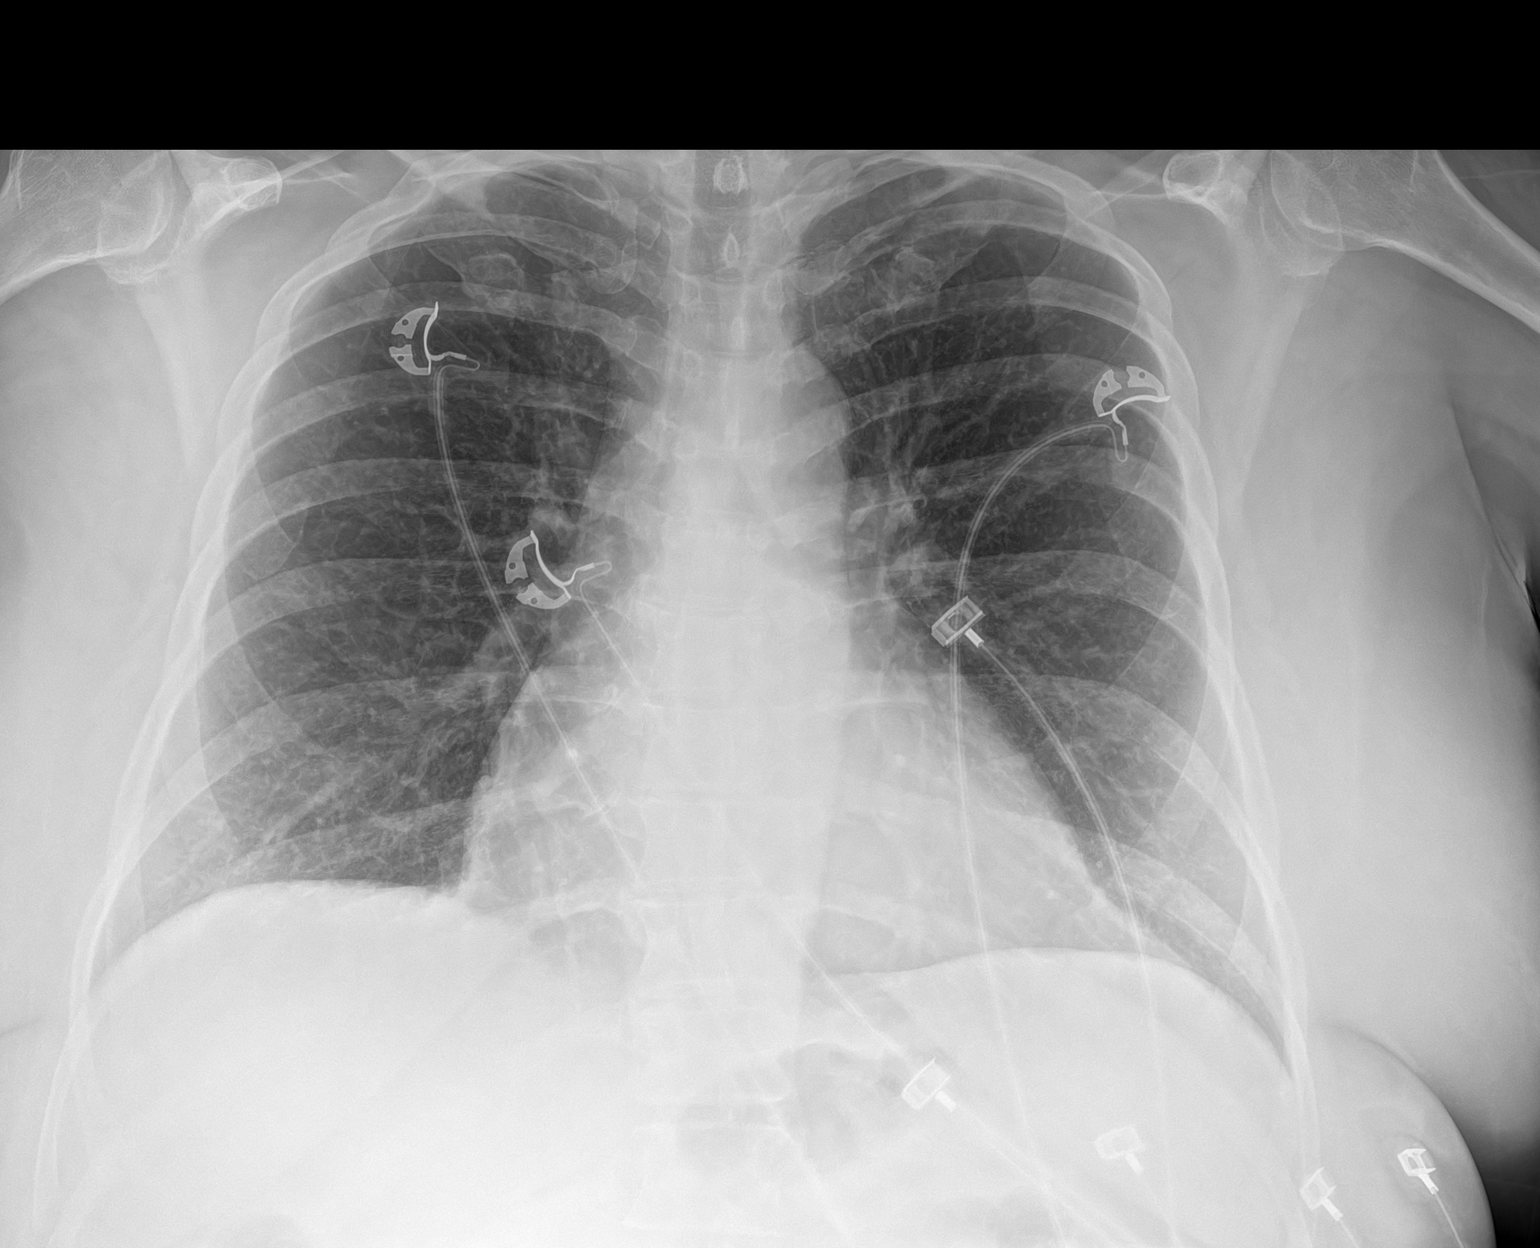

[1 of 1 positions shown; findings below may reference images not displayed]

FINDINGS: Upper normal heart size, likely accentuated by portable
technique.The cardiomediastinal contours are normal. The lungs are
clear. Pulmonary vasculature is normal. No consolidation, pleural
effusion, or pneumothorax. Remote right second rib fracture. No
acute osseous abnormalities are seen.
IMPRESSION: Upper normal heart size, likely accentuated by portable technique.
No acute chest findings.

## 2022-04-30 IMAGING — CT CT ANGIO CHEST
2 of 6 series · 19 of 46 positions shown · IV contrast (Omnipaque or Isovue)
Comparison: None.

CLINICAL DATA: Intermittent chest pain, vomiting, elevated D-dimer

EXAM:
CT ANGIOGRAPHY CHEST WITH CONTRAST
TECHNIQUE: Multidetector CT imaging of the chest was performed using the
standard protocol during bolus administration of intravenous
contrast. Multiplanar CT image reconstructions and MIPs were
obtained to evaluate the vascular anatomy.
CONTRAST:  100mL OMNIPAQUE IOHEXOL 350 MG/ML SOLN

[Series 5: pe axial thins · axial · 0.61mm/px · z∈[+1489,+1724]mm · 16 of 322 slices shown]
[im 14/322  lung]
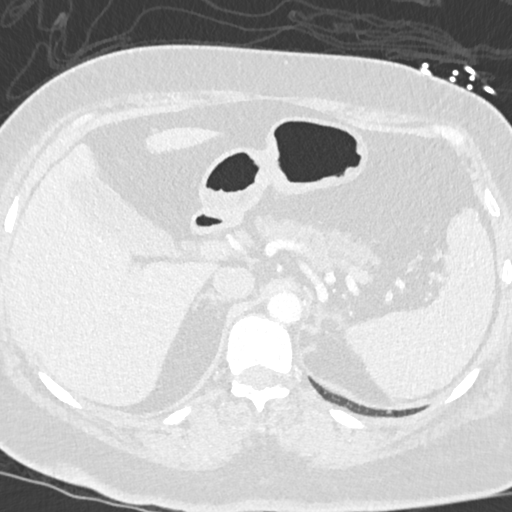
[im 41/322  soft-tissue]
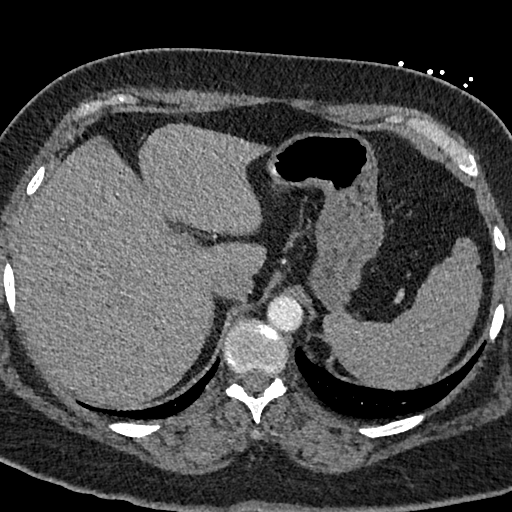
[im 54/322  lung]
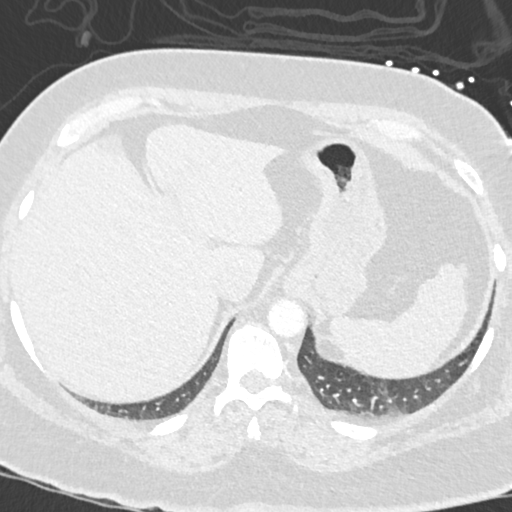
[im 81/322  soft-tissue]
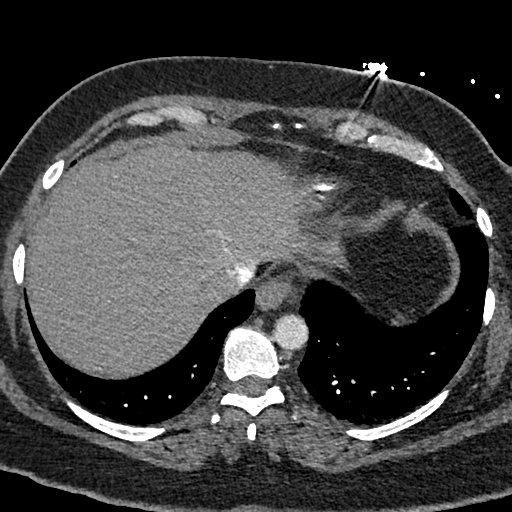
[im 94/322  lung]
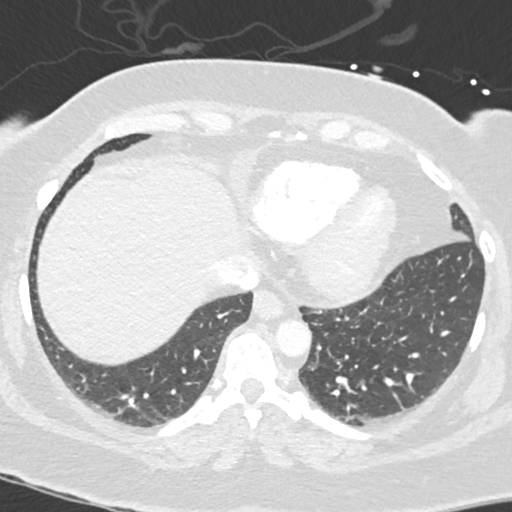
[im 108/322  soft-tissue]
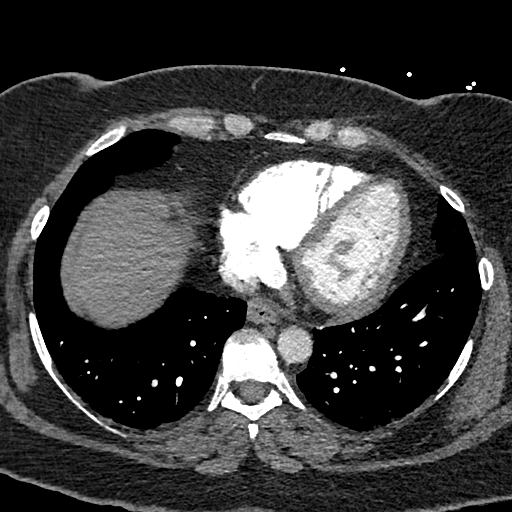
[im 134/322  lung]
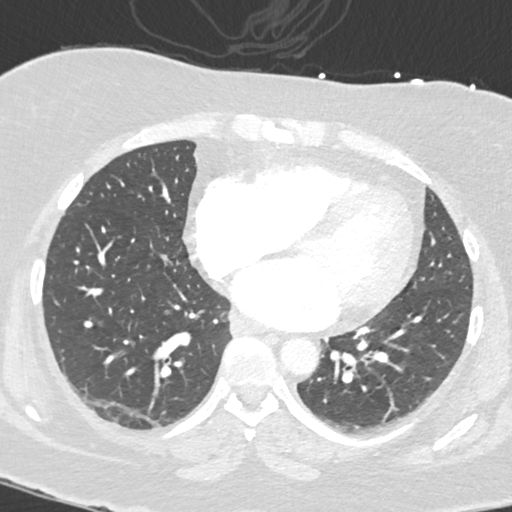
[im 148/322  soft-tissue]
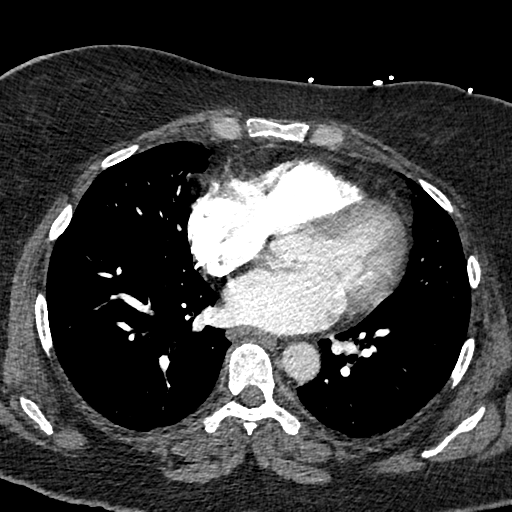
[im 174/322  lung]
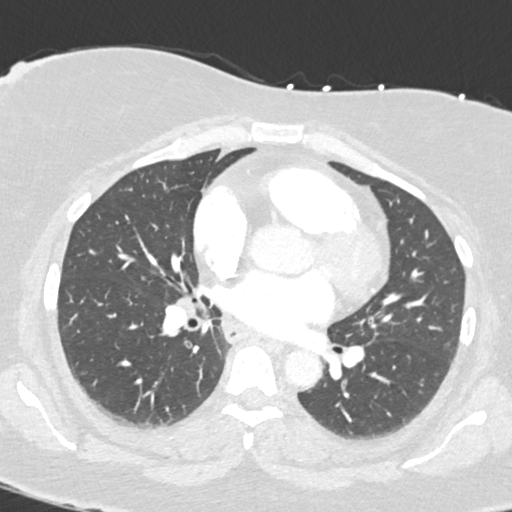
[im 188/322  soft-tissue]
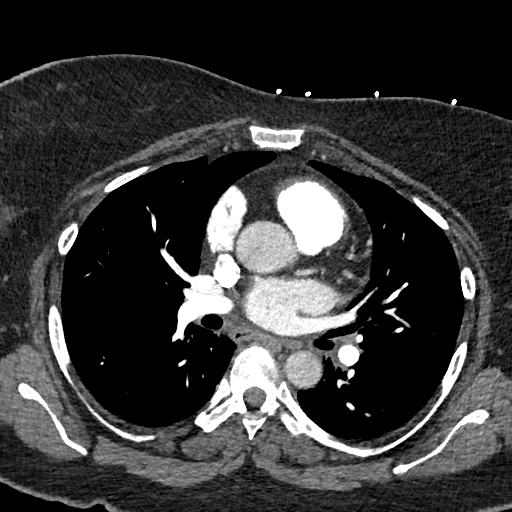
[im 215/322  lung]
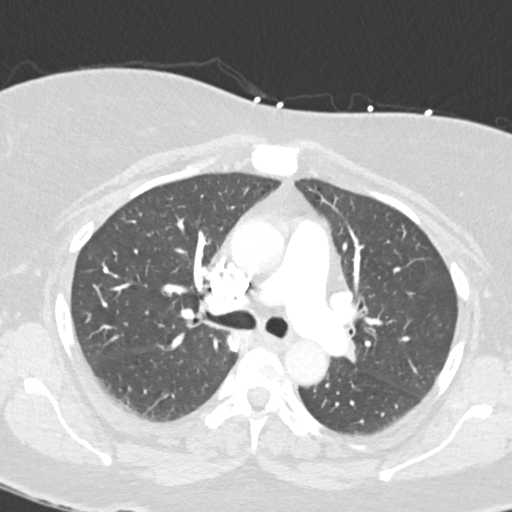
[im 228/322  soft-tissue]
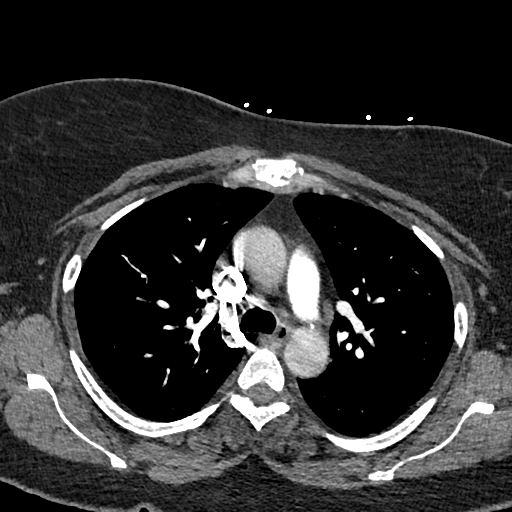
[im 241/322  lung]
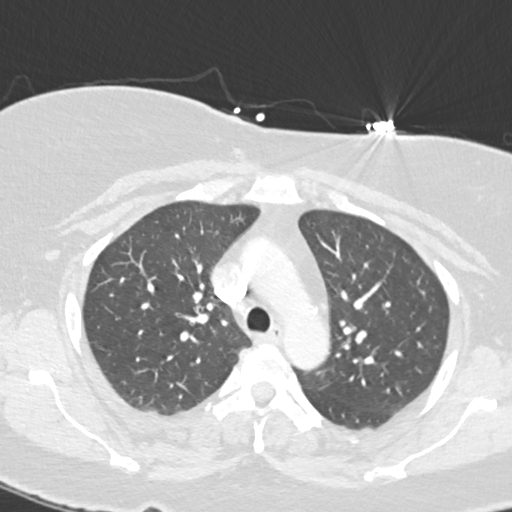
[im 268/322  soft-tissue]
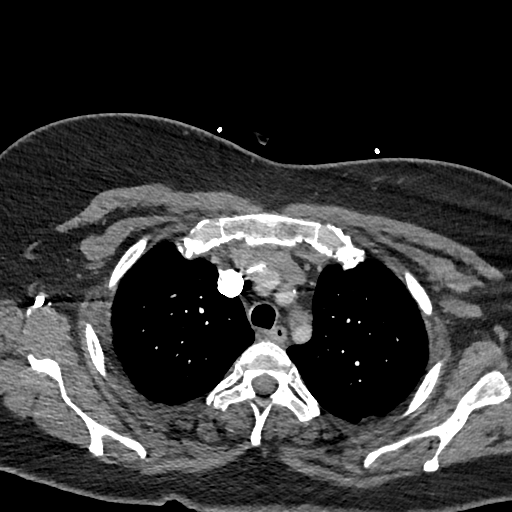
[im 281/322  lung]
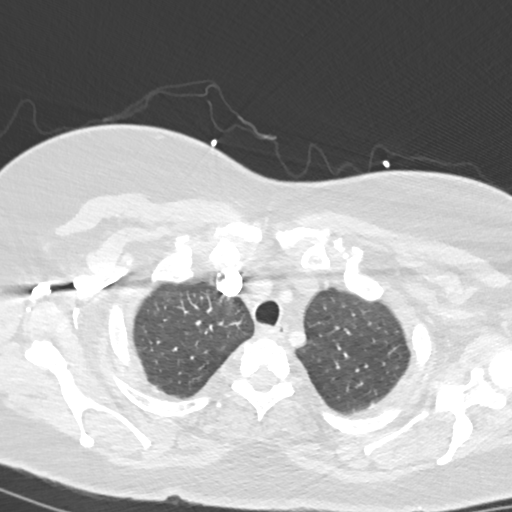
[im 308/322  soft-tissue]
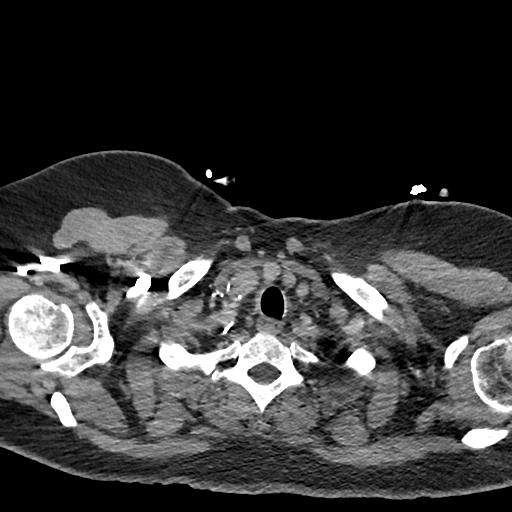

[Series 8: cor soft · coronal · 0.51mm/px · 3 of 121 slices shown]
[im 31/121  soft-tissue]
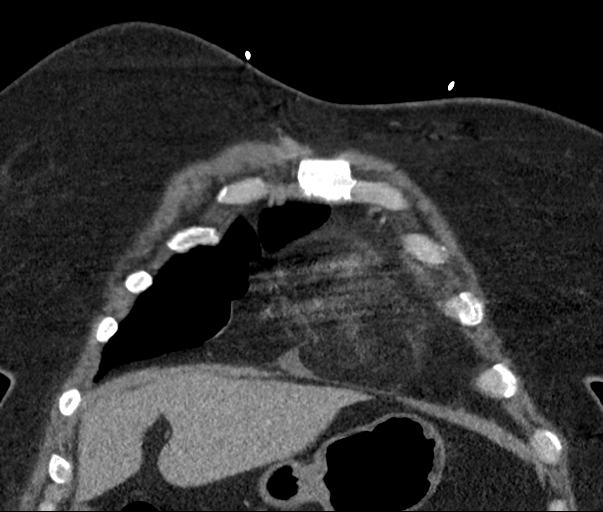
[im 61/121  soft-tissue]
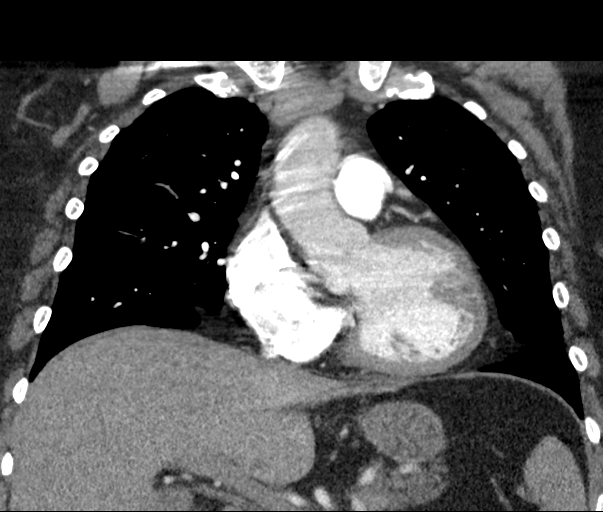
[im 91/121  soft-tissue]
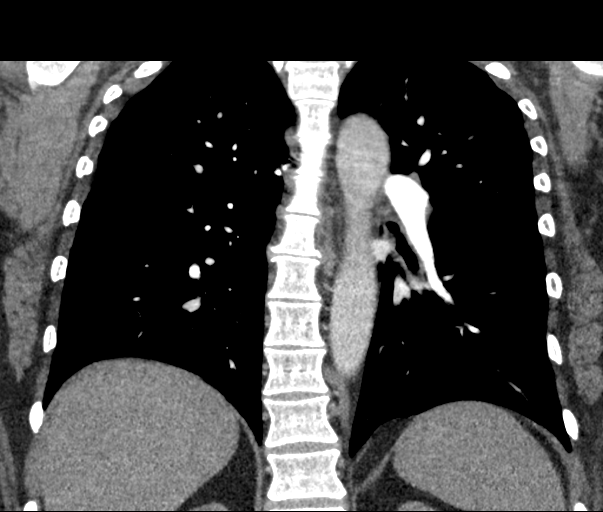

[19 of 46 positions shown; findings below may reference images not displayed]

FINDINGS: Cardiovascular: Satisfactory opacification of the pulmonary arteries
to the segmental level. No evidence of pulmonary embolism. Normal
heart size. No pericardial effusion.

Mediastinum/Nodes: The visualized thyroid is unremarkable. No
pathologic thoracic adenopathy. The esophagus is unremarkable. Small
hiatal hernia.

Lungs/Pleura: Lungs are clear. No pleural effusion or pneumothorax.

Upper Abdomen: No acute abnormality.

Musculoskeletal: No chest wall abnormality. No acute or significant
osseous findings.

Review of the MIP images confirms the above findings.
IMPRESSION: No pulmonary embolism.  No acute intrathoracic pathology identified.

Small hiatal hernia.

## 2022-05-02 DIAGNOSIS — Z79899 Other long term (current) drug therapy: Secondary | ICD-10-CM | POA: Diagnosis not present

## 2022-05-30 DIAGNOSIS — Z79899 Other long term (current) drug therapy: Secondary | ICD-10-CM | POA: Diagnosis not present

## 2022-06-12 DIAGNOSIS — K047 Periapical abscess without sinus: Secondary | ICD-10-CM | POA: Diagnosis not present

## 2022-06-27 DIAGNOSIS — Z79899 Other long term (current) drug therapy: Secondary | ICD-10-CM | POA: Diagnosis not present

## 2022-07-25 DIAGNOSIS — R69 Illness, unspecified: Secondary | ICD-10-CM | POA: Diagnosis not present

## 2022-07-25 DIAGNOSIS — Z79899 Other long term (current) drug therapy: Secondary | ICD-10-CM | POA: Diagnosis not present

## 2022-07-31 NOTE — Progress Notes (Signed)
Erroneous encounter - please disregard.

## 2022-08-01 ENCOUNTER — Ambulatory Visit (INDEPENDENT_AMBULATORY_CARE_PROVIDER_SITE_OTHER): Payer: Self-pay | Admitting: Internal Medicine

## 2022-08-22 DIAGNOSIS — F1124 Opioid dependence with opioid-induced mood disorder: Secondary | ICD-10-CM | POA: Diagnosis not present

## 2022-08-22 DIAGNOSIS — Z79899 Other long term (current) drug therapy: Secondary | ICD-10-CM | POA: Diagnosis not present

## 2022-09-09 DIAGNOSIS — Z419 Encounter for procedure for purposes other than remedying health state, unspecified: Secondary | ICD-10-CM | POA: Diagnosis not present

## 2022-09-12 ENCOUNTER — Ambulatory Visit: Payer: Medicaid Other | Admitting: Internal Medicine

## 2022-09-15 DIAGNOSIS — F17210 Nicotine dependence, cigarettes, uncomplicated: Secondary | ICD-10-CM | POA: Diagnosis not present

## 2022-09-15 DIAGNOSIS — Z79899 Other long term (current) drug therapy: Secondary | ICD-10-CM | POA: Diagnosis not present

## 2022-09-15 DIAGNOSIS — J449 Chronic obstructive pulmonary disease, unspecified: Secondary | ICD-10-CM | POA: Diagnosis not present

## 2022-09-15 DIAGNOSIS — F419 Anxiety disorder, unspecified: Secondary | ICD-10-CM | POA: Diagnosis not present

## 2022-09-15 DIAGNOSIS — L02213 Cutaneous abscess of chest wall: Secondary | ICD-10-CM | POA: Diagnosis not present

## 2022-09-15 DIAGNOSIS — K047 Periapical abscess without sinus: Secondary | ICD-10-CM | POA: Diagnosis not present

## 2022-09-15 DIAGNOSIS — F319 Bipolar disorder, unspecified: Secondary | ICD-10-CM | POA: Diagnosis not present

## 2022-09-15 DIAGNOSIS — R112 Nausea with vomiting, unspecified: Secondary | ICD-10-CM | POA: Diagnosis not present

## 2022-09-15 DIAGNOSIS — R197 Diarrhea, unspecified: Secondary | ICD-10-CM | POA: Diagnosis not present

## 2022-09-15 DIAGNOSIS — K029 Dental caries, unspecified: Secondary | ICD-10-CM | POA: Diagnosis not present

## 2022-09-19 DIAGNOSIS — F1124 Opioid dependence with opioid-induced mood disorder: Secondary | ICD-10-CM | POA: Diagnosis not present

## 2022-09-19 DIAGNOSIS — Z79899 Other long term (current) drug therapy: Secondary | ICD-10-CM | POA: Diagnosis not present

## 2022-09-26 DIAGNOSIS — N611 Abscess of the breast and nipple: Secondary | ICD-10-CM | POA: Diagnosis not present

## 2022-09-26 DIAGNOSIS — R69 Illness, unspecified: Secondary | ICD-10-CM | POA: Diagnosis not present

## 2022-09-26 DIAGNOSIS — F17210 Nicotine dependence, cigarettes, uncomplicated: Secondary | ICD-10-CM | POA: Diagnosis not present

## 2022-09-26 DIAGNOSIS — F319 Bipolar disorder, unspecified: Secondary | ICD-10-CM | POA: Diagnosis not present

## 2022-09-26 DIAGNOSIS — L0211 Cutaneous abscess of neck: Secondary | ICD-10-CM | POA: Diagnosis not present

## 2022-09-26 DIAGNOSIS — J449 Chronic obstructive pulmonary disease, unspecified: Secondary | ICD-10-CM | POA: Diagnosis not present

## 2022-10-10 DIAGNOSIS — Z419 Encounter for procedure for purposes other than remedying health state, unspecified: Secondary | ICD-10-CM | POA: Diagnosis not present

## 2022-10-17 DIAGNOSIS — F4312 Post-traumatic stress disorder, chronic: Secondary | ICD-10-CM | POA: Diagnosis not present

## 2022-10-17 DIAGNOSIS — Z79899 Other long term (current) drug therapy: Secondary | ICD-10-CM | POA: Diagnosis not present

## 2022-10-17 DIAGNOSIS — F411 Generalized anxiety disorder: Secondary | ICD-10-CM | POA: Diagnosis not present

## 2022-10-17 DIAGNOSIS — F1124 Opioid dependence with opioid-induced mood disorder: Secondary | ICD-10-CM | POA: Diagnosis not present

## 2022-10-17 DIAGNOSIS — Z5181 Encounter for therapeutic drug level monitoring: Secondary | ICD-10-CM | POA: Diagnosis not present

## 2022-10-17 DIAGNOSIS — R69 Illness, unspecified: Secondary | ICD-10-CM | POA: Diagnosis not present

## 2022-10-17 DIAGNOSIS — F3181 Bipolar II disorder: Secondary | ICD-10-CM | POA: Diagnosis not present

## 2022-11-10 DIAGNOSIS — Z419 Encounter for procedure for purposes other than remedying health state, unspecified: Secondary | ICD-10-CM | POA: Diagnosis not present

## 2022-11-14 DIAGNOSIS — F411 Generalized anxiety disorder: Secondary | ICD-10-CM | POA: Diagnosis not present

## 2022-11-14 DIAGNOSIS — F3181 Bipolar II disorder: Secondary | ICD-10-CM | POA: Diagnosis not present

## 2022-11-14 DIAGNOSIS — Z5181 Encounter for therapeutic drug level monitoring: Secondary | ICD-10-CM | POA: Diagnosis not present

## 2022-11-14 DIAGNOSIS — F4312 Post-traumatic stress disorder, chronic: Secondary | ICD-10-CM | POA: Diagnosis not present

## 2022-12-09 DIAGNOSIS — Z419 Encounter for procedure for purposes other than remedying health state, unspecified: Secondary | ICD-10-CM | POA: Diagnosis not present

## 2022-12-12 DIAGNOSIS — F3181 Bipolar II disorder: Secondary | ICD-10-CM | POA: Diagnosis not present

## 2022-12-12 DIAGNOSIS — F411 Generalized anxiety disorder: Secondary | ICD-10-CM | POA: Diagnosis not present

## 2022-12-12 DIAGNOSIS — Z5181 Encounter for therapeutic drug level monitoring: Secondary | ICD-10-CM | POA: Diagnosis not present

## 2022-12-12 DIAGNOSIS — F4312 Post-traumatic stress disorder, chronic: Secondary | ICD-10-CM | POA: Diagnosis not present

## 2023-01-09 DIAGNOSIS — Z5181 Encounter for therapeutic drug level monitoring: Secondary | ICD-10-CM | POA: Diagnosis not present

## 2023-01-09 DIAGNOSIS — Z419 Encounter for procedure for purposes other than remedying health state, unspecified: Secondary | ICD-10-CM | POA: Diagnosis not present

## 2023-01-09 DIAGNOSIS — F411 Generalized anxiety disorder: Secondary | ICD-10-CM | POA: Diagnosis not present

## 2023-01-09 DIAGNOSIS — F4312 Post-traumatic stress disorder, chronic: Secondary | ICD-10-CM | POA: Diagnosis not present

## 2023-01-09 DIAGNOSIS — F3181 Bipolar II disorder: Secondary | ICD-10-CM | POA: Diagnosis not present

## 2023-02-06 DIAGNOSIS — Z5181 Encounter for therapeutic drug level monitoring: Secondary | ICD-10-CM | POA: Diagnosis not present

## 2023-02-06 DIAGNOSIS — F4312 Post-traumatic stress disorder, chronic: Secondary | ICD-10-CM | POA: Diagnosis not present

## 2023-02-06 DIAGNOSIS — F411 Generalized anxiety disorder: Secondary | ICD-10-CM | POA: Diagnosis not present

## 2023-02-06 DIAGNOSIS — F3181 Bipolar II disorder: Secondary | ICD-10-CM | POA: Diagnosis not present

## 2023-02-08 DIAGNOSIS — Z419 Encounter for procedure for purposes other than remedying health state, unspecified: Secondary | ICD-10-CM | POA: Diagnosis not present

## 2023-03-01 ENCOUNTER — Other Ambulatory Visit: Payer: Self-pay

## 2023-03-01 ENCOUNTER — Emergency Department (HOSPITAL_COMMUNITY): Payer: Medicaid Other

## 2023-03-01 ENCOUNTER — Emergency Department (HOSPITAL_COMMUNITY)
Admission: EM | Admit: 2023-03-01 | Discharge: 2023-03-01 | Disposition: A | Discharge status: Home / Self Care | Payer: Medicaid Other | Attending: Emergency Medicine | Admitting: Emergency Medicine

## 2023-03-01 ENCOUNTER — Encounter (HOSPITAL_COMMUNITY): Payer: Self-pay | Admitting: *Deleted

## 2023-03-01 DIAGNOSIS — F17200 Nicotine dependence, unspecified, uncomplicated: Secondary | ICD-10-CM | POA: Insufficient documentation

## 2023-03-01 DIAGNOSIS — R079 Chest pain, unspecified: Secondary | ICD-10-CM | POA: Diagnosis not present

## 2023-03-01 DIAGNOSIS — R072 Precordial pain: Secondary | ICD-10-CM | POA: Insufficient documentation

## 2023-03-01 DIAGNOSIS — Z7982 Long term (current) use of aspirin: Secondary | ICD-10-CM | POA: Insufficient documentation

## 2023-03-01 DIAGNOSIS — R0602 Shortness of breath: Secondary | ICD-10-CM | POA: Diagnosis not present

## 2023-03-01 LAB — CBC
HCT: 39 % (ref 36.0–46.0)
Hemoglobin: 12.2 g/dL (ref 12.0–15.0)
MCH: 26.1 pg (ref 26.0–34.0)
MCHC: 31.3 g/dL (ref 30.0–36.0)
MCV: 83.3 fL (ref 80.0–100.0)
Platelets: 259 10*3/uL (ref 150–400)
RBC: 4.68 MIL/uL (ref 3.87–5.11)
RDW: 15.7 % — ABNORMAL HIGH (ref 11.5–15.5)
WBC: 7.2 10*3/uL (ref 4.0–10.5)
nRBC: 0 % (ref 0.0–0.2)

## 2023-03-01 LAB — TROPONIN I (HIGH SENSITIVITY)
Troponin I (High Sensitivity): 2 ng/L (ref <18)
Troponin I (High Sensitivity): 2 ng/L (ref <18)

## 2023-03-01 LAB — BASIC METABOLIC PANEL
Anion gap: 10 (ref 5–15)
BUN: 10 mg/dL (ref 6–20)
CO2: 27 mmol/L (ref 22–32)
Calcium: 9 mg/dL (ref 8.9–10.3)
Chloride: 104 mmol/L (ref 98–111)
Creatinine, Ser: 0.84 mg/dL (ref 0.44–1.00)
GFR, Estimated: >60 mL/min (ref >60)
Glucose, Bld: 96 mg/dL (ref 70–99)
Potassium: 3.5 mmol/L (ref 3.5–5.1)
Sodium: 141 mmol/L (ref 135–145)

## 2023-03-01 LAB — POC URINE PREG, ED: Preg Test, Ur: NEGATIVE

## 2023-03-01 NOTE — Discharge Instructions (Addendum)
We saw you in the ER for the chest pain/shortness of breath. All of our cardiac workup is normal, including labs, EKG and chest X-RAY are normal. We are not sure what is causing your discomfort, but we feel comfortable sending you home at this time. The workup in the ER is not complete, and you should follow up with your Cardiologist as planned next week.  Please return to the ER if you have worsening chest pain, shortness of breath, pain radiating to your jaw, shoulder, or back, sweats or fainting.

## 2023-03-01 NOTE — ED Triage Notes (Signed)
Pt with left sided CP since yesterday, today began to radiate down left arm with nausea and diaphoresis. + SOB

## 2023-03-01 NOTE — ED Provider Notes (Signed)
[place] EMERGENCY DEPARTMENT AT [facility] Provider Note   CSN: [id number] Arrival date & time: 03/01/23  1641     History  Chief Complaint  Patient presents with   Chest Pain    Caitlyn Irwin is a 47 y.o. female.  HPI     47 year old female comes in with chief complaint of left-sided chest pain.  Her chest pain started yesterday, but was more pronounced this morning around 7:30 AM.  At that time she started noticing chest pain radiating to the left side, and she had associated sweats, nausea and shortness of breath.  Patient's daughter-in-law is a Engineer, civil (consulting), advised that patient come to the emergency room for further assessment.  Patient states that while in the waiting room, her chest pain resolved.  She has intermittent episodes of mild chest discomfort.  She has had chest pain in the past and had negative stress test about 2 years back.  Patient admits to tobacco use disorder, hyperlipidemia.  Home Medications Prior to Admission medications   Medication Sig Start Date End Date Taking? Authorizing Provider  albuterol (VENTOLIN HFA) 108 (90 Base) MCG/ACT inhaler Inhale 2 puffs into the lungs every 6 (six) hours as needed for wheezing or shortness of breath. 06/25/21  Yes Coralyn Helling, MD  ARIPiprazole (ABILIFY) 10 MG tablet Take 10 mg by mouth daily. 02/08/21  Yes [provider]  aspirin 81 MG chewable tablet Chew 1 tablet (81 mg total) by mouth daily. 04/29/21  Yes Johnson, Clanford L, MD  Aspirin-Salicylamide-Caffeine (ARTHRITIS STRENGTH BC POWDER PO) Take 1 packet by mouth as needed (pain).   Yes [provider]  buprenorphine (SUBUTEX) 8 MG SUBL SL tablet Place 8 mg under the tongue 3 (three) times daily. 04/03/21  Yes [provider]  diazepam (VALIUM) 5 MG tablet Take 5 mg by mouth 2 (two) times daily.   Yes [provider]  prazosin (MINIPRESS) 1 MG capsule Take 1 mg by mouth at bedtime. Take with 2 mg at bedtime for a  total of 3 mg 02/24/23  Yes [provider]  prazosin (MINIPRESS) 2 MG capsule Take 2 mg by mouth at bedtime. Take 2 mg with 1 mg tablet total 3mg  at bedtime 02/08/21  Yes [provider]  QUEtiapine (SEROQUEL) 50 MG tablet Take 100 mg by mouth at bedtime. 03/01/21  Yes [provider]  Tiotropium Bromide Monohydrate 2.5 MCG/ACT AERS Inhale 1 each into the lungs daily. 10/31/21  Yes [provider]  sertraline (ZOLOFT) 100 MG tablet SMARTSIG:1.5 Tablet(s) By Mouth Every Evening Patient not taking: Reported on 03/01/2023 02/06/23   [provider]      Allergies    Patient has no known allergies.    Review of Systems   Review of Systems  All other systems reviewed and are negative.   Physical Exam Updated Vital Signs BP 120/72 (BP Location: Right Arm)   Pulse 65   Temp 97.8 F (36.6 C) (Oral)   Resp 17   Ht 5\' 7"  (1.702 m)   Wt 90.7 kg   LMP 02/15/2023 (Approximate)   SpO2 99%   BMI 31.32 kg/m  Physical Exam Vitals and nursing note reviewed.  Constitutional:      Appearance: She is well-developed.  HENT:     Head: Atraumatic.  Cardiovascular:     Rate and Rhythm: Normal rate.     Heart sounds: Normal heart sounds.  Pulmonary:     Effort: Pulmonary effort is normal.  Musculoskeletal:  Cervical back: Normal range of motion and neck supple.     Right lower leg: No tenderness. No edema.     Left lower leg: No tenderness. No edema.  Skin:    General: Skin is warm and dry.  Neurological:     Mental Status: She is alert and oriented to person, place, and time.     ED Results / Procedures / Treatments   Labs (all labs ordered are listed, but only abnormal results are displayed) Labs Reviewed  CBC - Abnormal; Notable for the following components:      Result Value   RDW 15.7 (*)    All other components within normal limits  BASIC METABOLIC PANEL  POC URINE PREG, ED  TROPONIN I (HIGH SENSITIVITY)  TROPONIN I (HIGH  SENSITIVITY)    EKG EKG Interpretation  Date/Time:  Wednesday Mar 01 2023 21:46:24 EDT Ventricular Rate:  67 PR Interval:  144 QRS Duration: 122 QT Interval:  428 QTC Calculation: 452 R Axis:   44 Text Interpretation: Sinus rhythm Nonspecific intraventricular conduction delay No acute changes No significant change since last tracing Confirmed by Derwood Kaplan [phone]) on 03/01/2023 10:02:44 PM  Radiology DG Chest 2 View  Result Date: 03/01/2023 CLINICAL DATA:  Chest pain and shortness of breath. EXAM: CHEST - 2 VIEW COMPARISON:  Chest radiographs 04/28/2021 FINDINGS: Cardiac silhouette and mediastinal contours are within normal limits. The lungs are clear. No pleural effusion or pneumothorax. No acute skeletal abnormality. IMPRESSION: No active cardiopulmonary disease. Electronically Signed   By: Neita Garnet M.D.   On: 03/01/2023 17:47    Procedures Procedures    Medications Ordered in ED Medications - No data to display  ED Course/ Medical Decision Making/ A&P             HEART Score: 4                Medical Decision Making Amount and/or Complexity of Data Reviewed Labs: ordered. Radiology: ordered.   47 year old female comes in with chief complaint of chest pain.  Patient indicates having left-sided chest pain, with radiation to the arm, diaphoresis and shortness of breath.  Chest pain has been constantly present since earlier this morning until she got to the emergency room.  I have reviewed patient's previous workup, including stress test from 2022 which was negative.  I have reviewed patient's CT PE which was negative for blood clots.  Differential diagnosis for this patient that was considered includes pulmonary embolism, arrhythmia, acute coronary syndrome, toxidrome.  Patient is EKG is reassuring.  No concerning findings on it.  Delta troponins ordered, they are undetectable level.  Hear score is 3.  Patient currently feels comfortable.  She is chest  pain-free.  Plan is to discharge her.  She has already called cardiology and has an appointment for next Thursday.  The patient appears reasonably screened and/or stabilized for discharge and I doubt any other medical condition or other [facility] requiring further screening, evaluation, or treatment in the ED at this time prior to discharge.   Results from the ER workup discussed with the patient face to face and all questions answered to the best of my ability. The patient is safe for discharge with strict return precautions.   Final Clinical Impression(s) / ED Diagnoses Final diagnoses:  Precordial chest pain    Rx / DC Orders ED Discharge Orders     None         Derwood Kaplan, MD 03/01/23 2346

## 2023-03-02 ENCOUNTER — Telehealth: Payer: Self-pay | Admitting: Internal Medicine

## 2023-03-02 NOTE — Telephone Encounter (Signed)
Pt states she was in the ER yesterday. She states they took her out of work for today and told her to not do any strenuous activity but she has to go back to work tomorrow and she doesn't feel comfortable doing that. 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   She states she still is having chest pain occasionally today. Please advise.

## 2023-03-02 NOTE — Telephone Encounter (Signed)
Spoke with pt and informed that we could not write her a letter out of work because she had not been evaluated by our office. Pt was understanding and states that she figured so, and that she was see [place] at her upcoming appt.

## 2023-03-08 DIAGNOSIS — F4312 Post-traumatic stress disorder, chronic: Secondary | ICD-10-CM | POA: Diagnosis not present

## 2023-03-08 DIAGNOSIS — Z5181 Encounter for therapeutic drug level monitoring: Secondary | ICD-10-CM | POA: Diagnosis not present

## 2023-03-08 DIAGNOSIS — F3181 Bipolar II disorder: Secondary | ICD-10-CM | POA: Diagnosis not present

## 2023-03-08 DIAGNOSIS — F411 Generalized anxiety disorder: Secondary | ICD-10-CM | POA: Diagnosis not present

## 2023-03-09 ENCOUNTER — Ambulatory Visit: Payer: Medicaid Other | Attending: Internal Medicine | Admitting: Internal Medicine

## 2023-03-09 ENCOUNTER — Encounter: Payer: Self-pay | Admitting: Internal Medicine

## 2023-03-09 VITALS — BP 114/68 | HR 81 | Ht 68.0 in | Wt 234.8 lb

## 2023-03-09 DIAGNOSIS — R079 Chest pain, unspecified: Secondary | ICD-10-CM

## 2023-03-09 DIAGNOSIS — I70213 Atherosclerosis of native arteries of extremities with intermittent claudication, bilateral legs: Secondary | ICD-10-CM | POA: Diagnosis not present

## 2023-03-09 DIAGNOSIS — Z720 Tobacco use: Secondary | ICD-10-CM

## 2023-03-09 DIAGNOSIS — I739 Peripheral vascular disease, unspecified: Secondary | ICD-10-CM

## 2023-03-09 DIAGNOSIS — I70219 Atherosclerosis of native arteries of extremities with intermittent claudication, unspecified extremity: Secondary | ICD-10-CM

## 2023-03-09 MED ORDER — METOPROLOL TARTRATE 25 MG PO TABS: 25.0000 mg | ORAL_TABLET | Freq: Two times a day (BID) | ORAL | 3 refills | Status: DC

## 2023-03-09 NOTE — Patient Instructions (Addendum)
Medication Instructions:  Your physician has recommended you make the following change in your medication:  Start Metoprolol Tartrate 25 mg twice daily Continue to take all other medications the same   Labwork: None  Testing/Procedures: Coronary CTA see instructions below Your physician has requested that you have an ankle brachial index (ABI). During this test an ultrasound and blood pressure cuff are used to evaluate the arteries that supply the arms and legs with blood. Allow thirty minutes for this exam. There are no restrictions or special instructions.   Follow-Up: Your physician recommends that you schedule a follow-up appointment in: 6 months  Any Other Special Instructions Will Be Listed Below (If Applicable).    Your cardiac CT will be scheduled at one of the below locations:   [facility] [address] [place], [place] [address] [phone]     Please follow these instructions carefully (unless otherwise directed):  On the Night Before the Test: Be sure to Drink plenty of water. Do not consume any caffeinated/decaffeinated beverages or chocolate 12 hours prior to your test.  Do not take any antihistamines 12 hours prior to your test. If the patient has contrast allergy: No contrast allergy  On the Day of the Test: Drink plenty of water until 1 hour prior to the test. Do not eat any food 1 hour prior to test. You may take your regular medications prior to the test. Hold BC Powder 12 Hours before Take metoprolol tartrate 100 mg (Lopressor) two hours prior to test. FEMALES- please wear underwire-free bra if available, avoid dresses & tight clothing       After the Test: Drink plenty of water. After receiving IV contrast, you may experience a mild flushed feeling. This is normal. On occasion, you may experience a mild rash up to 24 hours after the test. This is not dangerous. If this occurs, you can take Benadryl 25 mg and increase your fluid  intake. If you experience trouble breathing, this can be serious. If it is severe call 911 IMMEDIATELY. If it is mild, please call our office.  We will call to schedule your test 2-4 weeks out understanding that some insurance companies will need an authorization prior to the service being performed.   For non-scheduling related questions, please contact the cardiac imaging nurse navigator should you have any questions/concerns: Rockwell Alexandria, Cardiac Imaging Nurse Navigator Larey Brick, Cardiac Imaging Nurse Navigator [facility] Direct Office Dial: [phone]   For scheduling needs, including cancellations and rescheduling, please call [place], [phone].   If you need a refill on your cardiac medications before your next appointment, please call your pharmacy.

## 2023-03-09 NOTE — Progress Notes (Signed)
Cardiology Office Note  Date: 03/09/2023   ID: Caitlyn Irwin, DOB Mar 25, 1976, MRN [id number]  PCP:  Billie Lade, MD  Cardiologist:  Marjo Bicker, MD Electrophysiologist:  None   Reason for Office Visit: Evaluation chest pain    History of Present Illness: Caitlyn Irwin is a 47 y.o. female known to have COPD, GERD, nicotine abuse is here for follow-up visit.  Patient has been having exertional chest pressure lasting for at least 5 minutes, frequency 3 times per week, initially less frequent and worsening recently, started couple of years ago, occurs with exertion and resolves with rest.  She was seen in 2022 when NM stress test showed no evidence of ischemia and echocardiogram showed normal LVEF and no valvular abnormalities.  She had ER visit recently for similar episode of chest pain.  Currently smokes 1 pack/day and has been smoking since her teenage years.  Has some SOB but no recent worsening.  Has cramps in her bilateral lower calf muscles with exertion and resolves with rest.  No palpitations, syncope, leg swelling.  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 8 g 2   ARIPiprazole (ABILIFY) 10 MG tablet Take 10 mg by mouth daily.     aspirin 81 MG chewable tablet Chew 1 tablet (81 mg total) by mouth daily.     Aspirin-Salicylamide-Caffeine (ARTHRITIS STRENGTH BC POWDER PO) Take 1 packet by mouth as needed  (pain).     buprenorphine (SUBUTEX) 8 MG SUBL SL tablet Place 8 mg under the tongue 3 (three) times daily.     diazepam (VALIUM) 5 MG tablet Take 5 mg by mouth 2 (two) times daily.     metoprolol tartrate (LOPRESSOR) 25 MG tablet Take 1 tablet (25 mg total) by mouth 2 (two) times daily. 180 tablet 3   prazosin (MINIPRESS) 1 MG capsule Take 1 mg by mouth at bedtime. Take with 2 mg at bedtime for a total of 3 mg     prazosin (MINIPRESS) 2 MG capsule Take 2 mg by mouth at bedtime. Take 2 mg with 1 mg tablet total 3mg  at bedtime     QUEtiapine (SEROQUEL) 50 MG tablet Take 100 mg by mouth at bedtime.     Tiotropium Bromide Monohydrate 2.5 MCG/ACT AERS Inhale 1 each into the lungs daily.     No current facility-administered medications for this visit.   Allergies:  Patient has no known allergies.   Social History: The patient  reports that she has been smoking cigarettes. She started smoking about 34 years ago. She has a 30.00 pack-year smoking history. She has never used smokeless tobacco. She reports that she does not currently use drugs after having used the following drugs: Cocaine, Marijuana, Heroin, and Methamphetamines. She reports that she does not drink alcohol.   Family History: The patient's family  history includes Anxiety disorder in her mother; Diabetes in her mother; Heart attack in her mother; Heart disease in her mother; Hypertension in her father.   ROS:  Please see the history of present illness. Otherwise, complete review of systems is positive for none  All other systems are reviewed and negative.   Physical Exam: VS:  BP 114/68   Pulse 81   Ht 5\' 8"  (1.727 m)   Wt 234 lb 12.8 oz (106.5 kg)   LMP 02/15/2023 (Approximate)   SpO2 96%   BMI 35.70 kg/m , BMI Body mass index is 35.7 kg/m.  Wt Readings from Last 3 Encounters:  03/09/23 234 lb 12.8 oz (106.5 kg)  03/01/23 200 lb (90.7 kg)  06/25/21 213 lb (96.6 kg)    General: Patient appears comfortable at rest. HEENT:  Conjunctiva and lids normal, oropharynx clear with moist mucosa. Neck: Supple, no elevated JVP or carotid bruits, no thyromegaly. Lungs: Clear to auscultation, nonlabored breathing at rest. Cardiac: Regular rate and rhythm, no S3 or significant systolic murmur, no pericardial rub. Abdomen: Soft, nontender, no hepatomegaly, bowel sounds present, no guarding or rebound. Extremities: No pitting edema, distal pulses 2+. Skin: Warm and dry. Musculoskeletal: No kyphosis. Neuropsychiatric: Alert and oriented x3, affect grossly appropriate.  Recent Labwork: 03/01/2023: BUN 10; Creatinine, Ser 0.84; Hemoglobin 12.2; Platelets 259; Potassium 3.5; Sodium 141     Component Value Date/Time   CHOL 236 (H) 04/29/2021 0855   TRIG 103 04/29/2021 0855   HDL 33 (L) 04/29/2021 0855   CHOLHDL 7.2 04/29/2021 0855   VLDL 21 04/29/2021 0855   LDLCALC 182 (H) 04/29/2021 0855    Assessment and Plan:  Patient is a 47 year old F known to have COPD, GERD, nicotine abuse is here for follow-up visit.  # Cardiac chest pain -Patient has been having exertional chest pressure lasting for at least 5 minutes, frequency 3 times per week, initially less frequent and worsening recently, started couple of years ago, occurs with exertion and resolves with rest.  She was seen in 2022 when NM stress test showed no evidence of ischemia and echocardiogram showed normal LVEF and no valvular abnormalities. Will obtain CTA cardiac and start metoprolol tartrate 25 mg twice daily.  # Bilateral lower EXTR claudication, rule out PAD -Obtain ABI with ultrasound arterial Doppler lower extremities  # Nicotine abuse Currently smokes 1 pack/day.  Smoking cessation counseling provided. Smoking cessation instruction/counseling given:  counseled patient on the dangers of tobacco use, advised patient to stop smoking, and reviewed strategies to maximize success    I have spent a total of 30 minutes with patient reviewing chart, EKGs, labs and  examining patient as well as establishing an assessment and plan that was discussed with the patient.  > 50% of time was spent in direct patient care.    Medication Adjustments/Labs and Tests Ordered: Current medicines are reviewed at length with the patient today.  Concerns regarding medicines are outlined above.   Tests Ordered: Orders Placed This Encounter  Procedures   CT CORONARY MORPH W/CTA COR W/SCORE W/CA W/CM &/OR WO/CM   US ARTERIAL ABI (SCREENING LOWER EXTREMITY)    Medication Changes: Meds ordered this encounter  Medications   metoprolol tartrate (LOPRESSOR) 25 MG tablet    Sig: Take 1 tablet (25 mg total) by mouth 2 (two) times daily.    Dispense:  180 tablet    Refill:  3    03/09/2023-NEW    Disposition:  Follow up  6 months  Signed Dupree Givler  Verne Spurr, MD, 03/09/2023 12:30 PM    [facility] [address] [place], [place], [place] [address]

## 2023-03-11 DIAGNOSIS — Z419 Encounter for procedure for purposes other than remedying health state, unspecified: Secondary | ICD-10-CM | POA: Diagnosis not present

## 2023-03-13 ENCOUNTER — Telehealth: Payer: Self-pay | Admitting: Internal Medicine

## 2023-03-13 NOTE — Telephone Encounter (Signed)
Patient calling in to see where her referral was sent to because she havent heard anything. Please advise

## 2023-03-14 ENCOUNTER — Telehealth: Payer: Self-pay | Admitting: Internal Medicine

## 2023-03-14 NOTE — Telephone Encounter (Signed)
Checking percert on the following patient for testing scheduled at [facility].    US Arterial Seg   03/17/2023

## 2023-03-17 ENCOUNTER — Ambulatory Visit (HOSPITAL_COMMUNITY): Payer: Medicaid Other | Attending: Internal Medicine

## 2023-03-28 ENCOUNTER — Ambulatory Visit: Payer: Medicaid Other | Admitting: Internal Medicine

## 2023-04-05 DIAGNOSIS — F4312 Post-traumatic stress disorder, chronic: Secondary | ICD-10-CM | POA: Diagnosis not present

## 2023-04-05 DIAGNOSIS — F411 Generalized anxiety disorder: Secondary | ICD-10-CM | POA: Diagnosis not present

## 2023-04-05 DIAGNOSIS — F3181 Bipolar II disorder: Secondary | ICD-10-CM | POA: Diagnosis not present

## 2023-04-05 DIAGNOSIS — Z5181 Encounter for therapeutic drug level monitoring: Secondary | ICD-10-CM | POA: Diagnosis not present

## 2023-04-10 DIAGNOSIS — Z419 Encounter for procedure for purposes other than remedying health state, unspecified: Secondary | ICD-10-CM | POA: Diagnosis not present

## 2023-04-27 ENCOUNTER — Encounter (HOSPITAL_COMMUNITY): Payer: Self-pay

## 2023-05-03 DIAGNOSIS — Z79891 Long term (current) use of opiate analgesic: Secondary | ICD-10-CM | POA: Diagnosis not present

## 2023-05-03 DIAGNOSIS — F4312 Post-traumatic stress disorder, chronic: Secondary | ICD-10-CM | POA: Diagnosis not present

## 2023-05-03 DIAGNOSIS — F3181 Bipolar II disorder: Secondary | ICD-10-CM | POA: Diagnosis not present

## 2023-05-03 DIAGNOSIS — F411 Generalized anxiety disorder: Secondary | ICD-10-CM | POA: Diagnosis not present

## 2023-05-03 DIAGNOSIS — Z5181 Encounter for therapeutic drug level monitoring: Secondary | ICD-10-CM | POA: Diagnosis not present

## 2023-05-11 DIAGNOSIS — Z419 Encounter for procedure for purposes other than remedying health state, unspecified: Secondary | ICD-10-CM | POA: Diagnosis not present

## 2023-05-31 DIAGNOSIS — Z5181 Encounter for therapeutic drug level monitoring: Secondary | ICD-10-CM | POA: Diagnosis not present

## 2023-06-11 DIAGNOSIS — Z419 Encounter for procedure for purposes other than remedying health state, unspecified: Secondary | ICD-10-CM | POA: Diagnosis not present

## 2023-06-28 DIAGNOSIS — F411 Generalized anxiety disorder: Secondary | ICD-10-CM | POA: Diagnosis not present

## 2023-06-28 DIAGNOSIS — F3181 Bipolar II disorder: Secondary | ICD-10-CM | POA: Diagnosis not present

## 2023-06-28 DIAGNOSIS — Z5181 Encounter for therapeutic drug level monitoring: Secondary | ICD-10-CM | POA: Diagnosis not present

## 2023-07-26 DIAGNOSIS — Z5181 Encounter for therapeutic drug level monitoring: Secondary | ICD-10-CM | POA: Diagnosis not present

## 2023-07-26 DIAGNOSIS — F3181 Bipolar II disorder: Secondary | ICD-10-CM | POA: Diagnosis not present

## 2023-07-26 DIAGNOSIS — F411 Generalized anxiety disorder: Secondary | ICD-10-CM | POA: Diagnosis not present

## 2023-07-26 DIAGNOSIS — F4312 Post-traumatic stress disorder, chronic: Secondary | ICD-10-CM | POA: Diagnosis not present

## 2023-07-27 DIAGNOSIS — F3181 Bipolar II disorder: Secondary | ICD-10-CM | POA: Diagnosis not present

## 2023-08-02 ENCOUNTER — Emergency Department (HOSPITAL_COMMUNITY)
Admission: EM | Admit: 2023-08-02 | Discharge: 2023-08-02 | Disposition: A | Discharge status: Home / Self Care | Payer: Medicaid Other | Attending: Emergency Medicine | Admitting: Emergency Medicine

## 2023-08-02 ENCOUNTER — Encounter (HOSPITAL_COMMUNITY): Payer: Self-pay | Admitting: Radiology

## 2023-08-02 ENCOUNTER — Emergency Department (HOSPITAL_COMMUNITY): Payer: Medicaid Other

## 2023-08-02 ENCOUNTER — Other Ambulatory Visit: Payer: Self-pay

## 2023-08-02 DIAGNOSIS — N83201 Unspecified ovarian cyst, right side: Secondary | ICD-10-CM | POA: Diagnosis not present

## 2023-08-02 DIAGNOSIS — K5900 Constipation, unspecified: Secondary | ICD-10-CM | POA: Insufficient documentation

## 2023-08-02 DIAGNOSIS — Z7982 Long term (current) use of aspirin: Secondary | ICD-10-CM | POA: Insufficient documentation

## 2023-08-02 DIAGNOSIS — J449 Chronic obstructive pulmonary disease, unspecified: Secondary | ICD-10-CM | POA: Insufficient documentation

## 2023-08-02 DIAGNOSIS — R112 Nausea with vomiting, unspecified: Secondary | ICD-10-CM | POA: Diagnosis not present

## 2023-08-02 DIAGNOSIS — R1031 Right lower quadrant pain: Secondary | ICD-10-CM | POA: Diagnosis present

## 2023-08-02 DIAGNOSIS — K5730 Diverticulosis of large intestine without perforation or abscess without bleeding: Secondary | ICD-10-CM | POA: Diagnosis not present

## 2023-08-02 LAB — COMPREHENSIVE METABOLIC PANEL
ALT: 17 U/L (ref 0–44)
AST: 23 U/L (ref 15–41)
Albumin: 3.5 g/dL (ref 3.5–5.0)
Alkaline Phosphatase: 60 U/L (ref 38–126)
Anion gap: 11 (ref 5–15)
BUN: 14 mg/dL (ref 6–20)
CO2: 26 mmol/L (ref 22–32)
Calcium: 8.7 mg/dL — ABNORMAL LOW (ref 8.9–10.3)
Chloride: 100 mmol/L (ref 98–111)
Creatinine, Ser: 0.86 mg/dL (ref 0.44–1.00)
GFR, Estimated: >60 mL/min (ref >60)
Glucose, Bld: 119 mg/dL — ABNORMAL HIGH (ref 70–99)
Potassium: 3.4 mmol/L — ABNORMAL LOW (ref 3.5–5.1)
Sodium: 137 mmol/L (ref 135–145)
Total Bilirubin: 0.5 mg/dL (ref 0.3–1.2)
Total Protein: 7.5 g/dL (ref 6.5–8.1)

## 2023-08-02 LAB — URINALYSIS, ROUTINE W REFLEX MICROSCOPIC
Bilirubin Urine: NEGATIVE
Glucose, UA: NEGATIVE mg/dL
Hgb urine dipstick: NEGATIVE
Ketones, ur: NEGATIVE mg/dL
Nitrite: NEGATIVE
Protein, ur: NEGATIVE mg/dL
Specific Gravity, Urine: 1.021 (ref 1.005–1.030)
pH: 7 (ref 5.0–8.0)

## 2023-08-02 LAB — CBC
HCT: 39.9 % (ref 36.0–46.0)
Hemoglobin: 12.6 g/dL (ref 12.0–15.0)
MCH: 26.5 pg (ref 26.0–34.0)
MCHC: 31.6 g/dL (ref 30.0–36.0)
MCV: 84 fL (ref 80.0–100.0)
Platelets: 234 10*3/uL (ref 150–400)
RBC: 4.75 MIL/uL (ref 3.87–5.11)
RDW: 16.2 % — ABNORMAL HIGH (ref 11.5–15.5)
WBC: 5.6 10*3/uL (ref 4.0–10.5)
nRBC: 0 % (ref 0.0–0.2)

## 2023-08-02 LAB — LIPASE, BLOOD: Lipase: 24 U/L (ref 11–51)

## 2023-08-02 LAB — POC URINE PREG, ED: Preg Test, Ur: NEGATIVE

## 2023-08-02 MED ORDER — IOHEXOL 300 MG/ML  SOLN
100.0000 mL | Freq: Once | INTRAMUSCULAR | Status: AC | PRN
  Administered 2023-08-02: 100 mL via INTRAVENOUS

## 2023-08-02 MED ORDER — ONDANSETRON 8 MG PO TBDP
8.0000 mg | ORAL_TABLET | Freq: Once | ORAL | Status: AC
  Administered 2023-08-02: 8 mg via ORAL
  Filled 2023-08-02: qty 1

## 2023-08-02 NOTE — Discharge Instructions (Signed)
As discussed, you have constipation.  I recommend that you take over-the-counter MiraLAX, 1 capful mixed in 8 to 10 ounces of water juice or tea.  Take 1-2 times a day until your constipation resolves.  You also have a small cyst to right ovary.  This may be causing some discomfort.  Recommend that you take over-the-counter ibuprofen and apply warm heat such as a heating pad off-and-on to the area.  Please follow-up with your OB/GYN or family tree as listed in a few days for recheck if not improving.  Return to emergency department if you develop any new or worsening symptoms.

## 2023-08-02 NOTE — ED Provider Notes (Signed)
[place] EMERGENCY DEPARTMENT AT [facility] Provider Note   CSN: [id number] Arrival date & time: 08/02/23  1234     History  Chief Complaint  Patient presents with   Abdominal Pain    Amantha Mccommon Elmes is a 47 y.o. female.   Abdominal Pain Associated symptoms: nausea and vomiting   Associated symptoms: no chest pain, no chills, no dysuria, no fever, no shortness of breath, no vaginal bleeding and no vaginal discharge         Blessed Carringer Guadiana is a 47 y.o. female past medical history of bipolar, GERD, COPD, anxiety, anemia and seizures.  She presents to the Emergency Department complaining of intermittent nausea vomiting x 2 weeks.  She also has some pain of her right lower quadrant for 2 weeks as well.  Yesterday, began having sharp pains to the same area mostly associated with coughing.  Dull abdominal pain has been constant, nonradiating.  She has been unable to tolerate solid foods, drinking liquids.  Denies any known fever, several loose stools yesterday which she states is also unusual for her.  Denies any dysuria, flank pain, abnormal vaginal bleeding or discharge.  Surgical history includes tubal ligation in the 1990s    Home Medications Prior to Admission medications   Medication Sig Start Date End Date Taking? Authorizing Provider  albuterol (VENTOLIN HFA) 108 (90 Base) MCG/ACT inhaler Inhale 2 puffs into the lungs every 6 (six) hours as needed for wheezing or shortness of breath. 06/25/21   Coralyn Helling, MD  ARIPiprazole (ABILIFY) 10 MG tablet Take 10 mg by mouth daily. 02/08/21   [provider]  aspirin 81 MG chewable tablet Chew 1 tablet (81 mg total) by mouth daily. 04/29/21   Johnson, Clanford L, MD  Aspirin-Salicylamide-Caffeine (ARTHRITIS STRENGTH BC POWDER PO) Take 1 packet by mouth as needed (pain).    [provider]  buprenorphine (SUBUTEX) 8 MG SUBL SL tablet Place 8 mg under the tongue 3 (three) times daily. 04/03/21   [provider]  diazepam (VALIUM) 5 MG tablet Take 5 mg by mouth 2 (two) times daily.    [provider]  metoprolol tartrate (LOPRESSOR) 25 MG tablet Take 1 tablet (25 mg total) by mouth 2 (two) times daily. 03/09/23   Mallipeddi, Vishnu P, MD  prazosin (MINIPRESS) 1 MG capsule Take 1 mg by mouth at bedtime. Take with 2 mg at bedtime for a total of 3 mg 02/24/23   [provider]  prazosin (MINIPRESS) 2 MG capsule Take 2 mg by mouth at bedtime. Take 2 mg with 1 mg tablet total 3mg  at bedtime 02/08/21   [provider]  QUEtiapine (SEROQUEL) 50 MG tablet Take 100 mg by mouth at bedtime. 03/01/21   [provider]  Tiotropium Bromide Monohydrate 2.5 MCG/ACT AERS Inhale 1 each into the lungs daily. 10/31/21   [provider]      Allergies    Sulfa antibiotics    Review of Systems   Review of Systems  Constitutional:  Positive for appetite change. Negative for chills and fever.  Respiratory:  Negative for shortness of breath.   Cardiovascular:  Negative for chest pain.  Gastrointestinal:  Positive for abdominal pain, nausea and vomiting.  Genitourinary:  Negative for dysuria, flank pain, vaginal bleeding and vaginal discharge.  Musculoskeletal:  Negative for back pain.  Skin:  Negative for rash.  Neurological:  Negative for dizziness, weakness and numbness.    Physical Exam Updated Vital Signs BP 129/85  Pulse 88   Temp 98.3 F (36.8 C) (Oral)   Resp 16   Ht 5\' 7"  (1.702 m)   Wt 106.6 kg   SpO2 99%   BMI 36.81 kg/m  Physical Exam Vitals and nursing note reviewed.  Constitutional:      General: She is not in acute distress.    Appearance: She is well-developed. She is not toxic-appearing.  Cardiovascular:     Rate and Rhythm: Normal rate and regular rhythm.     Pulses: Normal pulses.  Pulmonary:     Effort: Pulmonary effort is normal.  Abdominal:     Palpations: Abdomen is soft.     Tenderness: There is abdominal tenderness in the  right lower quadrant.     Comments: Tenderness to palpation right lower quadrant, abdomen is soft, no guarding or rebound tenderness on exam.  Musculoskeletal:        General: Normal range of motion.  Skin:    General: Skin is warm.     Capillary Refill: Capillary refill takes less than 2 seconds.  Neurological:     General: No focal deficit present.     Mental Status: She is alert.     Sensory: No sensory deficit.     Motor: No weakness.     ED Results / Procedures / Treatments   Labs (all labs ordered are listed, but only abnormal results are displayed) Labs Reviewed  COMPREHENSIVE METABOLIC PANEL - Abnormal; Notable for the following components:      Result Value   Potassium 3.4 (*)    Glucose, Bld 119 (*)    Calcium 8.7 (*)    All other components within normal limits  CBC - Abnormal; Notable for the following components:   RDW 16.2 (*)    All other components within normal limits  URINALYSIS, ROUTINE W REFLEX MICROSCOPIC - Abnormal; Notable for the following components:   APPearance HAZY (*)    Leukocytes,Ua MODERATE (*)    Bacteria, UA FEW (*)    All other components within normal limits  LIPASE, BLOOD  POC URINE PREG, ED    EKG None  Radiology CT ABDOMEN PELVIS W CONTRAST  Result Date: 08/02/2023 CLINICAL DATA:  Right lower quadrant pain on and off for 2 weeks with some nausea and vomiting EXAM: CT ABDOMEN AND PELVIS WITH CONTRAST TECHNIQUE: Multidetector CT imaging of the abdomen and pelvis was performed using the standard protocol following bolus administration of intravenous contrast. RADIATION DOSE REDUCTION: This exam was performed according to the departmental dose-optimization program which includes automated exposure control, adjustment of the mA and/or kV according to patient size and/or use of iterative reconstruction technique. CONTRAST:  OMNIPAQUE IOHEXOL 300 MG/ML  SOLN COMPARISON:  None Available. FINDINGS: Lower chest: There is some linear  opacity seen along bases likely scar or atelectasis. No pleural effusion. Hepatobiliary: Patent portal vein. Gallbladder has some questionable stones but is nondilated. Tiny cystic focus in the caudate lobe on series 2, image 23, too small to completely characterize although statistically likely a benign cystic lesion and no specific imaging follow-up. Pancreas: Unremarkable. No pancreatic ductal dilatation or surrounding inflammatory changes. Spleen: Normal in size without focal abnormality. Adrenals/Urinary Tract: The adrenal glands are preserved. No enhancing or aggressive renal mass. No collecting system dilatation. Tiny cystic focus centrally in the left kidney is too small to completely characterize although likely a benign cystic focus. Ureters have normal course and caliber down to the bladder. Preserved contours of the urinary bladder. Stomach/Bowel: On  this non oral contrast exam the stomach has some luminal fluid and air and is nondilated. Small bowel is nondilated. Distal small bowel stool appearance seen, nonspecific. The large bowel has moderate colonic stool. No obstruction or dilatation. Normal appendix seen in the right lower quadrant. Few scattered colonic diverticula. Vascular/Lymphatic: Aortic atherosclerosis. No enlarged abdominal or pelvic lymph nodes. Reproductive: Retroverted uterus. Small cystic structure in the right ovary measures 13 mm. Possible follicle. No specific imaging follow-up. No separate adnexal mass. Other: No free air or free fluid. Musculoskeletal: Scattered degenerative changes along the spine. 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 Associated sclerosis and disc height loss with osteophytes. IMPRESSION: Moderate colonic stool. Few colonic diverticula. No obstruction, free air or free fluid. Normal appendix. Possible gallstones. Follow up ultrasound as clinically appropriate. 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 Electronically  Signed   By: Karen Kays M.D.   On: 08/02/2023 17:59    Procedures Procedures    Medications Ordered in ED Medications  ondansetron (ZOFRAN-ODT) disintegrating tablet 8 mg (8 mg Oral Given 08/02/23 1442)  iohexol (OMNIPAQUE) 300 MG/ML solution 100 mL (100 mLs Intravenous Contrast Given 08/02/23 1501)    ED Course/ Medical Decision Making/ A&P                                 Medical Decision Making Patient here with 2-week history of nausea vomiting with dull right lower quadrant pain.  Yesterday pain was also associated with sharp stabbing pains to the same area with coughing.  No flank pain or dysuria symptoms.  She denies any abnormal vaginal bleeding or discharge.  No known fever.  Differential would include but not limited to acute pancreatitis, pelvic process, UTI, she is well-appearing on her exam.  Afebrile and vital signs reassuring.  Will need labs, urinalysis and likely CT abdomen and pelvis.  Amount and/or Complexity of Data Reviewed Labs: ordered.    Details: Labs interpreted by me, no evidence of leukocytosis, chemistries without significant derangement, urinalysis shows moderate leukocytes, 6-10 white cells and few bacteria with 11-20 squamous cells.  She does not have dysuria symptoms I feel that this is likely contaminant.  Urine pregnancy test negative. Radiology: ordered.    Details: CT abdomen and pelvis shows moderate stool, normal-appearing appendix.  Has a 13 mm cyst of right ovary likely follicle Discussion of management or test interpretation with external provider(s): I suspect symptoms are related to constipation.  Workup without evidence of acute appendicitis.  Discussed use of MiraLAX for her constipation.  Will also follow-up with her OB/GYN.  Appropriate for discharge home.  Risk Prescription drug management.           Final Clinical Impression(s) / ED Diagnoses Final diagnoses:  Constipation, unspecified constipation type  Cyst of right ovary     Rx / DC Orders ED Discharge Orders     None         Rosey Bath 08/03/23 1220    Gloris Manchester, MD 08/06/23 2348

## 2023-08-02 NOTE — ED Triage Notes (Signed)
Pt states she has had nausea/vomiting off and on for 2 weeks. Pt states she has abd pain in her right lower abd and yesterday when she would cough it would be a sharp stabbing pain.

## 2023-08-24 DIAGNOSIS — Z5181 Encounter for therapeutic drug level monitoring: Secondary | ICD-10-CM | POA: Diagnosis not present

## 2023-09-11 ENCOUNTER — Encounter: Payer: Medicaid Other | Admitting: Internal Medicine

## 2023-09-11 NOTE — Progress Notes (Signed)
Erroneous encounter - please disregard.

## 2024-04-02 ENCOUNTER — Ambulatory Visit: Payer: MEDICAID

## 2024-04-02 VITALS — BP 122/80 | HR 79 | Ht 67.0 in | Wt 241.0 lb

## 2024-04-02 DIAGNOSIS — J449 Chronic obstructive pulmonary disease, unspecified: Secondary | ICD-10-CM | POA: Diagnosis not present

## 2024-04-02 DIAGNOSIS — E785 Hyperlipidemia, unspecified: Secondary | ICD-10-CM

## 2024-04-02 DIAGNOSIS — Z720 Tobacco use: Secondary | ICD-10-CM

## 2024-04-02 MED ORDER — ALBUTEROL SULFATE HFA 108 (90 BASE) MCG/ACT IN AERS
2.0000 | INHALATION_SPRAY | Freq: Four times a day (QID) | RESPIRATORY_TRACT | 3 refills | Status: AC | PRN
Start: 2024-04-02 — End: ?

## 2024-04-02 MED ORDER — TIOTROPIUM BROMIDE MONOHYDRATE 2.5 MCG/ACT IN AERS
1.0000 | INHALATION_SPRAY | Freq: Every day | RESPIRATORY_TRACT | 11 refills | Status: AC
Start: 2024-04-02 — End: ?

## 2024-04-02 NOTE — Progress Notes (Unsigned)
 New Patient Office Visit  Subjective    Patient ID: Caitlyn Irwin, female    DOB: 08-15-1976  Age: 48 y.o. MRN: [id number]  CC:  Chief Complaint  Patient presents with   Establish Care    Pt here to establish care     HPI Caitlyn Irwin presents to establish care Needs refills on inhalers  Outpatient Encounter Medications as of 04/02/2024  Medication Sig   buprenorphine  (SUBUTEX ) 8 MG SUBL SL tablet Place 8 mg under the tongue 3 (three) times daily.   diazepam  (VALIUM ) 5 MG tablet Take 5 mg by mouth 2 (two) times daily.   prazosin  (MINIPRESS ) 1 MG capsule Take 1 mg by mouth at bedtime. Take with 2 mg at bedtime for a total of 3 mg   prazosin  (MINIPRESS ) 2 MG capsule Take 2 mg by mouth at bedtime. Take 2 mg with 1 mg tablet total 3mg  at bedtime   QUEtiapine  (SEROQUEL ) 50 MG tablet Take 100 mg by mouth at bedtime.   [DISCONTINUED] albuterol  (VENTOLIN  HFA) 108 (90 Base) MCG/ACT inhaler Inhale 2 puffs into the lungs every 6 (six) hours as needed for wheezing or shortness of breath.   [DISCONTINUED] metoprolol  tartrate (LOPRESSOR ) 25 MG tablet Take 1 tablet (25 mg total) by mouth 2 (two) times daily.   [DISCONTINUED] Tiotropium Bromide Monohydrate 2.5 MCG/ACT AERS Inhale 1 each into the lungs daily.   albuterol  (VENTOLIN  HFA) 108 (90 Base) MCG/ACT inhaler Inhale 2 puffs into the lungs every 6 (six) hours as needed for wheezing or shortness of breath.   ARIPiprazole  (ABILIFY ) 10 MG tablet Take 10 mg by mouth daily.   Tiotropium Bromide Monohydrate 2.5 MCG/ACT AERS Inhale 1 each into the lungs daily.   [DISCONTINUED] aspirin  81 MG chewable tablet Chew 1 tablet (81 mg total) by mouth daily. (Patient not taking: Reported on 04/02/2024)   [DISCONTINUED] Aspirin -Salicylamide-Caffeine (ARTHRITIS STRENGTH BC POWDER PO) Take 1 packet by mouth as needed (pain). (Patient not taking: Reported on 04/02/2024)   No facility-administered encounter medications on file as of 04/02/2024.    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   Severe anxiety     Past Surgical History:  Procedure Laterality Date   TUBAL LIGATION  1993    Family History  Problem Relation Age of Onset   Diabetes Mother    Heart attack Mother    Heart disease Mother    Anxiety disorder Mother    Hypertension Father     Social History   Socioeconomic History   Marital status: Divorced    Spouse name: Not on file   Number of children: Not on file   Years of education: Not on file   Highest education level: Not on file  Occupational History   Not on file  Tobacco Use   Smoking status: Every Day    Current packs/day: 1.00    Average packs/day: 1 pack/day for 35.6 years (35.6 ttl pk-yrs)    Types: Cigarettes    Start date: 09/11/1988   Smokeless tobacco: Never   Tobacco comments:    1 ppd 06/25/21  Vaping Use   Vaping status: Never Used  Substance and Sexual Activity  Alcohol use: No    Alcohol/week: 0.0 standard drinks of alcohol    Comment: beer in the past.  none since 2010   Drug use: Not Currently    Types: Cocaine, Marijuana, Heroin, Methamphetamines    Comment: last drug use 2015   Sexual activity: Not Currently    Birth control/protection: Surgical    Comment: my tubes is tied  Other Topics Concern   Not on file  Social History Narrative   Not on file   Social Drivers of Health   Financial Resource Strain: Not on file  Food Insecurity: Not on file  Transportation Needs: Not on file  Physical Activity: Not on file  Stress: Not on file  Social Connections: Not on file  Intimate Partner Violence: Not on file    ROS      Objective    BP 122/80   Pulse 79   Ht 5' 7 (1.702 m)   Wt 241 lb (109.3 kg)   SpO2 91%   BMI 37.75  kg/m   Physical Exam Vitals and nursing note reviewed.  Constitutional:      Appearance: Normal appearance. She is obese.  HENT:     Head: Normocephalic.     Right Ear: Tympanic membrane, ear canal and external ear normal.     Left Ear: Tympanic membrane, ear canal and external ear normal.     Nose: Nose normal.     Mouth/Throat:     Mouth: Mucous membranes are moist.     Pharynx: Oropharynx is clear.   Cardiovascular:     Rate and Rhythm: Normal rate and regular rhythm.  Pulmonary:     Effort: Pulmonary effort is normal.     Breath sounds: Wheezing present. No rhonchi.   Musculoskeletal:     Cervical back: Normal range of motion and neck supple.   Skin:    General: Skin is warm and dry.   Neurological:     Mental Status: She is alert and oriented to person, place, and time.   Psychiatric:        Mood and Affect: Mood normal.        Thought Content: Thought content normal.         Assessment & Plan:   Problem List Items Addressed This Visit       Respiratory   COPD (chronic obstructive pulmonary disease) (HCC) - Primary   Maintenance and rescue inhaler refilled.   Recommend smoking cessation.       Relevant Medications   albuterol  (VENTOLIN  HFA) 108 (90 Base) MCG/ACT inhaler   Tiotropium Bromide Monohydrate 2.5 MCG/ACT AERS     Other   Nicotine  abuse (Chronic)   Smoking cessation instruction/counseling given:  counseled patient on the dangers of tobacco use, advised patient to stop smoking, and reviewed strategies to maximize success 4 minutes spend on smoking cessation counseling.         Return in about 3 months (around 07/03/2024) for for yearly physical.   Leita Longs, FNP

## 2024-04-03 NOTE — Assessment & Plan Note (Signed)
 Smoking cessation instruction/counseling given:  counseled patient on the dangers of tobacco use, advised patient to stop smoking, and reviewed strategies to maximize success 4 minutes spend on smoking cessation counseling.

## 2024-04-03 NOTE — Assessment & Plan Note (Signed)
 Maintenance and rescue inhaler refilled.   Recommend smoking cessation.
